# Patient Record
Sex: Female | Born: 1980 | Race: White | Hispanic: No | Marital: Married | Smoking: Never smoker
Health system: Southern US, Community
[De-identification: ages and names within clinical notes are randomized; demographics above are authoritative.]

## PROBLEM LIST (undated history)

## (undated) ENCOUNTER — Emergency Department (HOSPITAL_BASED_OUTPATIENT_CLINIC_OR_DEPARTMENT_OTHER): Admission: EM | Source: Home / Self Care

## (undated) DIAGNOSIS — E039 Hypothyroidism, unspecified: Secondary | ICD-10-CM

## (undated) DIAGNOSIS — E109 Type 1 diabetes mellitus without complications: Secondary | ICD-10-CM

## (undated) DIAGNOSIS — K900 Celiac disease: Secondary | ICD-10-CM

## (undated) DIAGNOSIS — E611 Iron deficiency: Secondary | ICD-10-CM

## (undated) DIAGNOSIS — L4050 Arthropathic psoriasis, unspecified: Secondary | ICD-10-CM

## (undated) DIAGNOSIS — K219 Gastro-esophageal reflux disease without esophagitis: Secondary | ICD-10-CM

## (undated) HISTORY — PX: NO PAST SURGERIES: SHX2092

## (undated) HISTORY — DX: Celiac disease: K90.0

## (undated) HISTORY — DX: Arthropathic psoriasis, unspecified: L40.50

## (undated) HISTORY — DX: Type 1 diabetes mellitus without complications: E10.9

## (undated) HISTORY — DX: Iron deficiency: E61.1

---

## 2015-01-20 DIAGNOSIS — R5383 Other fatigue: Secondary | ICD-10-CM | POA: Insufficient documentation

## 2015-07-27 DIAGNOSIS — R1010 Upper abdominal pain, unspecified: Secondary | ICD-10-CM | POA: Diagnosis not present

## 2015-07-27 DIAGNOSIS — K6389 Other specified diseases of intestine: Secondary | ICD-10-CM | POA: Diagnosis not present

## 2015-07-27 DIAGNOSIS — K649 Unspecified hemorrhoids: Secondary | ICD-10-CM | POA: Diagnosis not present

## 2015-07-27 DIAGNOSIS — K2980 Duodenitis without bleeding: Secondary | ICD-10-CM | POA: Diagnosis not present

## 2015-07-27 DIAGNOSIS — K2950 Unspecified chronic gastritis without bleeding: Secondary | ICD-10-CM | POA: Diagnosis not present

## 2015-07-27 DIAGNOSIS — K921 Melena: Secondary | ICD-10-CM | POA: Diagnosis not present

## 2015-08-07 DIAGNOSIS — E109 Type 1 diabetes mellitus without complications: Secondary | ICD-10-CM | POA: Diagnosis not present

## 2015-08-07 DIAGNOSIS — Z794 Long term (current) use of insulin: Secondary | ICD-10-CM | POA: Diagnosis not present

## 2015-09-22 DIAGNOSIS — Z794 Long term (current) use of insulin: Secondary | ICD-10-CM | POA: Diagnosis not present

## 2015-09-22 DIAGNOSIS — E109 Type 1 diabetes mellitus without complications: Secondary | ICD-10-CM | POA: Diagnosis not present

## 2015-11-01 DIAGNOSIS — K219 Gastro-esophageal reflux disease without esophagitis: Secondary | ICD-10-CM | POA: Diagnosis not present

## 2015-11-01 DIAGNOSIS — K900 Celiac disease: Secondary | ICD-10-CM | POA: Diagnosis not present

## 2015-11-10 DIAGNOSIS — E109 Type 1 diabetes mellitus without complications: Secondary | ICD-10-CM | POA: Diagnosis not present

## 2015-11-10 DIAGNOSIS — E039 Hypothyroidism, unspecified: Secondary | ICD-10-CM | POA: Diagnosis not present

## 2015-12-21 DIAGNOSIS — J329 Chronic sinusitis, unspecified: Secondary | ICD-10-CM | POA: Diagnosis not present

## 2015-12-21 DIAGNOSIS — J40 Bronchitis, not specified as acute or chronic: Secondary | ICD-10-CM | POA: Diagnosis not present

## 2016-03-05 DIAGNOSIS — L409 Psoriasis, unspecified: Secondary | ICD-10-CM | POA: Diagnosis not present

## 2016-03-05 DIAGNOSIS — M2550 Pain in unspecified joint: Secondary | ICD-10-CM | POA: Diagnosis not present

## 2016-03-08 DIAGNOSIS — E039 Hypothyroidism, unspecified: Secondary | ICD-10-CM | POA: Diagnosis not present

## 2016-03-08 DIAGNOSIS — K900 Celiac disease: Secondary | ICD-10-CM | POA: Diagnosis not present

## 2016-03-08 DIAGNOSIS — E109 Type 1 diabetes mellitus without complications: Secondary | ICD-10-CM | POA: Diagnosis not present

## 2016-03-29 DIAGNOSIS — Z794 Long term (current) use of insulin: Secondary | ICD-10-CM | POA: Diagnosis not present

## 2016-03-29 DIAGNOSIS — E109 Type 1 diabetes mellitus without complications: Secondary | ICD-10-CM | POA: Diagnosis not present

## 2016-04-11 DIAGNOSIS — L409 Psoriasis, unspecified: Secondary | ICD-10-CM | POA: Diagnosis not present

## 2016-04-11 DIAGNOSIS — L4059 Other psoriatic arthropathy: Secondary | ICD-10-CM | POA: Diagnosis not present

## 2016-04-11 DIAGNOSIS — M2550 Pain in unspecified joint: Secondary | ICD-10-CM | POA: Diagnosis not present

## 2016-04-22 DIAGNOSIS — J018 Other acute sinusitis: Secondary | ICD-10-CM | POA: Diagnosis not present

## 2016-04-22 DIAGNOSIS — R110 Nausea: Secondary | ICD-10-CM | POA: Diagnosis not present

## 2016-04-22 DIAGNOSIS — E109 Type 1 diabetes mellitus without complications: Secondary | ICD-10-CM | POA: Diagnosis not present

## 2016-05-06 DIAGNOSIS — L409 Psoriasis, unspecified: Secondary | ICD-10-CM | POA: Diagnosis not present

## 2016-05-06 DIAGNOSIS — Z79899 Other long term (current) drug therapy: Secondary | ICD-10-CM | POA: Diagnosis not present

## 2016-06-04 DIAGNOSIS — L409 Psoriasis, unspecified: Secondary | ICD-10-CM | POA: Diagnosis not present

## 2016-07-07 LAB — HM PAP SMEAR: HM PAP: NORMAL

## 2016-07-09 DIAGNOSIS — Z794 Long term (current) use of insulin: Secondary | ICD-10-CM | POA: Diagnosis not present

## 2016-07-09 DIAGNOSIS — E109 Type 1 diabetes mellitus without complications: Secondary | ICD-10-CM | POA: Diagnosis not present

## 2016-07-23 DIAGNOSIS — E039 Hypothyroidism, unspecified: Secondary | ICD-10-CM | POA: Diagnosis not present

## 2016-07-23 DIAGNOSIS — N760 Acute vaginitis: Secondary | ICD-10-CM | POA: Diagnosis not present

## 2016-07-23 DIAGNOSIS — L298 Other pruritus: Secondary | ICD-10-CM | POA: Diagnosis not present

## 2016-07-23 DIAGNOSIS — L409 Psoriasis, unspecified: Secondary | ICD-10-CM | POA: Diagnosis not present

## 2016-07-23 DIAGNOSIS — Z01419 Encounter for gynecological examination (general) (routine) without abnormal findings: Secondary | ICD-10-CM | POA: Diagnosis not present

## 2016-07-23 DIAGNOSIS — L4059 Other psoriatic arthropathy: Secondary | ICD-10-CM | POA: Diagnosis not present

## 2016-07-23 DIAGNOSIS — M2550 Pain in unspecified joint: Secondary | ICD-10-CM | POA: Diagnosis not present

## 2016-07-23 DIAGNOSIS — E109 Type 1 diabetes mellitus without complications: Secondary | ICD-10-CM | POA: Diagnosis not present

## 2016-07-30 DIAGNOSIS — L409 Psoriasis, unspecified: Secondary | ICD-10-CM | POA: Diagnosis not present

## 2016-07-30 DIAGNOSIS — L739 Follicular disorder, unspecified: Secondary | ICD-10-CM | POA: Diagnosis not present

## 2016-07-30 DIAGNOSIS — Z79899 Other long term (current) drug therapy: Secondary | ICD-10-CM | POA: Diagnosis not present

## 2016-08-29 DIAGNOSIS — Z794 Long term (current) use of insulin: Secondary | ICD-10-CM | POA: Diagnosis not present

## 2016-08-29 DIAGNOSIS — E109 Type 1 diabetes mellitus without complications: Secondary | ICD-10-CM | POA: Diagnosis not present

## 2016-09-10 DIAGNOSIS — L409 Psoriasis, unspecified: Secondary | ICD-10-CM | POA: Diagnosis not present

## 2016-10-17 DIAGNOSIS — R031 Nonspecific low blood-pressure reading: Secondary | ICD-10-CM | POA: Diagnosis not present

## 2016-11-19 DIAGNOSIS — L409 Psoriasis, unspecified: Secondary | ICD-10-CM | POA: Diagnosis not present

## 2016-11-19 DIAGNOSIS — Z79899 Other long term (current) drug therapy: Secondary | ICD-10-CM | POA: Diagnosis not present

## 2016-11-21 DIAGNOSIS — E109 Type 1 diabetes mellitus without complications: Secondary | ICD-10-CM | POA: Diagnosis not present

## 2016-11-21 DIAGNOSIS — Z794 Long term (current) use of insulin: Secondary | ICD-10-CM | POA: Diagnosis not present

## 2017-01-31 DIAGNOSIS — L409 Psoriasis, unspecified: Secondary | ICD-10-CM | POA: Diagnosis not present

## 2017-01-31 DIAGNOSIS — L700 Acne vulgaris: Secondary | ICD-10-CM | POA: Diagnosis not present

## 2017-02-03 ENCOUNTER — Emergency Department (HOSPITAL_BASED_OUTPATIENT_CLINIC_OR_DEPARTMENT_OTHER): Payer: BLUE CROSS/BLUE SHIELD

## 2017-02-03 ENCOUNTER — Observation Stay (HOSPITAL_BASED_OUTPATIENT_CLINIC_OR_DEPARTMENT_OTHER)
Admission: EM | Admit: 2017-02-03 | Discharge: 2017-02-04 | Disposition: A | Discharge status: Home / Self Care | Payer: BLUE CROSS/BLUE SHIELD | Attending: Family Medicine | Admitting: Family Medicine

## 2017-02-03 ENCOUNTER — Encounter (HOSPITAL_BASED_OUTPATIENT_CLINIC_OR_DEPARTMENT_OTHER): Payer: Self-pay | Admitting: Emergency Medicine

## 2017-02-03 DIAGNOSIS — E119 Type 2 diabetes mellitus without complications: Secondary | ICD-10-CM | POA: Insufficient documentation

## 2017-02-03 DIAGNOSIS — K219 Gastro-esophageal reflux disease without esophagitis: Secondary | ICD-10-CM | POA: Diagnosis present

## 2017-02-03 DIAGNOSIS — E039 Hypothyroidism, unspecified: Secondary | ICD-10-CM | POA: Diagnosis not present

## 2017-02-03 DIAGNOSIS — M7989 Other specified soft tissue disorders: Secondary | ICD-10-CM | POA: Insufficient documentation

## 2017-02-03 DIAGNOSIS — M79641 Pain in right hand: Principal | ICD-10-CM | POA: Insufficient documentation

## 2017-02-03 DIAGNOSIS — M659 Synovitis and tenosynovitis, unspecified: Secondary | ICD-10-CM | POA: Diagnosis not present

## 2017-02-03 DIAGNOSIS — M65949 Unspecified synovitis and tenosynovitis, unspecified hand: Secondary | ICD-10-CM

## 2017-02-03 DIAGNOSIS — Z794 Long term (current) use of insulin: Secondary | ICD-10-CM | POA: Insufficient documentation

## 2017-02-03 DIAGNOSIS — M65941 Unspecified synovitis and tenosynovitis, right hand: Secondary | ICD-10-CM | POA: Insufficient documentation

## 2017-02-03 DIAGNOSIS — E079 Disorder of thyroid, unspecified: Secondary | ICD-10-CM | POA: Diagnosis not present

## 2017-02-03 DIAGNOSIS — L089 Local infection of the skin and subcutaneous tissue, unspecified: Secondary | ICD-10-CM

## 2017-02-03 DIAGNOSIS — S61234A Puncture wound without foreign body of right ring finger without damage to nail, initial encounter: Secondary | ICD-10-CM | POA: Diagnosis not present

## 2017-02-03 DIAGNOSIS — M65841 Other synovitis and tenosynovitis, right hand: Secondary | ICD-10-CM | POA: Diagnosis not present

## 2017-02-03 DIAGNOSIS — E109 Type 1 diabetes mellitus without complications: Secondary | ICD-10-CM

## 2017-02-03 DIAGNOSIS — Z79899 Other long term (current) drug therapy: Secondary | ICD-10-CM | POA: Diagnosis not present

## 2017-02-03 HISTORY — DX: Gastro-esophageal reflux disease without esophagitis: K21.9

## 2017-02-03 HISTORY — DX: Hypothyroidism, unspecified: E03.9

## 2017-02-03 LAB — CBC WITH DIFFERENTIAL/PLATELET
BASOS ABS: 0 10*3/uL (ref 0.0–0.1)
Basophils Relative: 0 %
EOS PCT: 2 %
Eosinophils Absolute: 0.1 10*3/uL (ref 0.0–0.7)
HCT: 40.5 % (ref 36.0–46.0)
Hemoglobin: 14.1 g/dL (ref 12.0–15.0)
LYMPHS PCT: 47 %
Lymphs Abs: 2.8 10*3/uL (ref 0.7–4.0)
MCH: 30.4 pg (ref 26.0–34.0)
MCHC: 34.8 g/dL (ref 30.0–36.0)
MCV: 87.3 fL (ref 78.0–100.0)
Monocytes Absolute: 0.6 10*3/uL (ref 0.1–1.0)
Monocytes Relative: 10 %
Neutro Abs: 2.4 10*3/uL (ref 1.7–7.7)
Neutrophils Relative %: 41 %
PLATELETS: 259 10*3/uL (ref 150–400)
RBC: 4.64 MIL/uL (ref 3.87–5.11)
RDW: 12.3 % (ref 11.5–15.5)
WBC: 6 10*3/uL (ref 4.0–10.5)

## 2017-02-03 LAB — BASIC METABOLIC PANEL
ANION GAP: 7 (ref 5–15)
BUN: 17 mg/dL (ref 6–20)
CO2: 26 mmol/L (ref 22–32)
Calcium: 9.2 mg/dL (ref 8.9–10.3)
Chloride: 102 mmol/L (ref 101–111)
Creatinine, Ser: 0.7 mg/dL (ref 0.44–1.00)
GLUCOSE: 85 mg/dL (ref 65–99)
POTASSIUM: 3.6 mmol/L (ref 3.5–5.1)
Sodium: 135 mmol/L (ref 135–145)

## 2017-02-03 LAB — CBG MONITORING, ED
GLUCOSE-CAPILLARY: 75 mg/dL (ref 65–99)
GLUCOSE-CAPILLARY: 82 mg/dL (ref 65–99)

## 2017-02-03 MED ORDER — SODIUM CHLORIDE 0.9 % IV BOLUS (SEPSIS)
1000.0000 mL | Freq: Once | INTRAVENOUS | Status: AC
  Administered 2017-02-04: 1000 mL via INTRAVENOUS

## 2017-02-03 MED ORDER — IBUPROFEN 600 MG PO TABS: 600.0000 mg | ORAL_TABLET | Freq: Four times a day (QID) | ORAL | Status: DC

## 2017-02-03 MED ORDER — SODIUM CHLORIDE 0.9 % IV SOLN
1.5000 g | Freq: Once | INTRAVENOUS | Status: AC
  Administered 2017-02-04: 1.5 g via INTRAVENOUS
  Filled 2017-02-03: qty 1.5

## 2017-02-03 MED ORDER — SODIUM CHLORIDE 0.9 % IV SOLN
INTRAVENOUS | Status: DC
  Administered 2017-02-04: 07:00:00 via INTRAVENOUS

## 2017-02-03 MED ORDER — OXYCODONE-ACETAMINOPHEN 5-325 MG PO TABS: 1.0000 | ORAL_TABLET | ORAL | Status: DC | PRN

## 2017-02-03 MED ORDER — MORPHINE SULFATE (PF) 2 MG/ML IV SOLN
2.0000 mg | INTRAVENOUS | Status: DC | PRN
  Administered 2017-02-04: 2 mg via INTRAVENOUS
  Filled 2017-02-03: qty 1

## 2017-02-03 NOTE — ED Provider Notes (Signed)
[facility] EMERGENCY DEPARTMENT Provider Note   CSN: [id number] Arrival date & time: 02/03/17  1812     History   Chief Complaint Chief Complaint  Patient presents with  . Hand Pain    HPI   Blood pressure (!) 163/98, pulse 66, temperature 98.4 F (36.9 C), temperature source Oral, resp. rate 18, height 5' 2"  (1.575 m), weight 54 kg (119 lb), last menstrual period 01/13/2017, SpO2 100 %.  Sarah Flynn is a 36 y.o. female with past medical history significant for insulin dependent diabetes (she is on a pump) complaining of severe pain to right fourth digit worsening over the course of last day.  She states that she had a puncture wound to the palm just below the fourth digit 3 weeks ago and a the form of a plant entered the palm.  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  She states that the swelling has gotten significantly worse over the last 24 hours.  She is right-hand dominant.  She states that her sugars have been running in the 130s, she disconnected her pump prior to arrival.  She had lunch at around noon and has had no solids since that time.  Past Medical History:  Diagnosis Date  . Diabetes mellitus without complication ([place])   . GERD (gastroesophageal reflux disease)   . Hypothyroidism   . Thyroid disease     Patient Active Problem List   Diagnosis Date Noted  . Tenosynovitis of right hand 02/03/2017  . GERD (gastroesophageal reflux disease)   . Diabetes mellitus without complication ([place])   . Hypothyroidism     History reviewed. No pertinent surgical history.  OB History    No data available       Home Medications    Prior to Admission medications   Medication Sig Start Date End Date Taking? Authorizing Provider  doxycycline  (VIBRAMYCIN) 50 MG capsule Take 50 mg by mouth 2 (two) times daily.   Yes [provider]  insulin aspart (NOVOLOG) 100 UNIT/ML injection Inject into the skin 3 (three) times daily before meals.   Yes [provider]  levothyroxine (SYNTHROID, LEVOTHROID) 50 MCG tablet Take 50 mcg by mouth daily before breakfast.   Yes [provider]  omeprazole (PRILOSEC) 20 MG capsule Take 20 mg by mouth daily.   Yes [provider]    Family History History reviewed. No pertinent family history.  Social History Social History  Substance Use Topics  . Smoking status: Never Smoker  . Smokeless tobacco: Never Used  . Alcohol use No     Allergies   Patient has no known allergies.   Review of Systems Review of Systems  A complete review of systems was obtained and all systems are negative except as noted in the HPI and PMH.   Physical Exam Updated Vital Signs BP (!) 152/80 (BP Location: Left Arm)   Pulse 69   Temp 98.4 F (36.9 C) (Oral)   Resp 20   Ht 5' 2"  (1.575 m)   Wt 54 kg (119 lb)   LMP 01/13/2017   SpO2 100%   BMI 21.77 kg/m   Physical Exam  Constitutional: She is oriented to person, place, and time. She appears well-developed and well-nourished. No distress.  HENT:  Head: Normocephalic and atraumatic.  Mouth/Throat: Oropharynx is clear and moist.  Eyes: Pupils are equal, round, and reactive to light. Conjunctivae and EOM are normal.  Neck: Normal range of motion.  Cardiovascular: Normal rate, regular rhythm and intact distal pulses.   Pulmonary/Chest: Effort normal and breath sounds normal.  Abdominal: Soft. There is no tenderness.  Musculoskeletal: She exhibits edema and tenderness. She exhibits no deformity.  Diffuse swelling to the right fourth digit, exquisitely tender to palpation along the flexor tendon sheath, Pain with passive extension of the finger.  Distally neurovascularly intact.  No tenderness to palpation along the initial  puncture wound site at the head of the fourth metacarpal.  Neurological: She is alert and oriented to person, place, and time.  Skin: She is not diaphoretic.  Psychiatric: She has a normal mood and affect.  Nursing note and vitals reviewed.    ED Treatments / Results  Labs (all labs ordered are listed, but only abnormal results are displayed) Labs Reviewed  CULTURE, BLOOD (ROUTINE X 2)  CULTURE, BLOOD (ROUTINE X 2)  CBC WITH DIFFERENTIAL/PLATELET  BASIC METABOLIC PANEL  LACTIC ACID, PLASMA  HCG, QUANTITATIVE, PREGNANCY  CBG MONITORING, ED  CBG MONITORING, ED    EKG  EKG Interpretation None       Radiology Dg Hand Complete Right  Result Date: 02/03/2017 CLINICAL DATA:  Right hand pain/swelling EXAM: RIGHT HAND - COMPLETE 3+ VIEW COMPARISON:  None. FINDINGS: No fracture or dislocation is seen. The joint spaces are preserved. Visualized soft tissues are within normal limits. No radiopaque foreign body is seen. IMPRESSION: No fracture, dislocation, or radiopaque foreign body is seen. Electronically Signed   By: Julian Hy M.D.   On: 02/03/2017 18:41    Procedures Procedures (including critical care time)  Medications Ordered in ED Medications  0.9 %  sodium chloride infusion (not administered)  ampicillin-sulbactam (UNASYN) 1.5 g in sodium chloride 0.9 % 50 mL IVPB (not administered)  ibuprofen (ADVIL,MOTRIN) tablet 600 mg (not administered)  oxyCODONE-acetaminophen (PERCOCET/ROXICET) 5-325 MG per tablet 1 tablet (not administered)  morphine 2 MG/ML injection 2 mg (not administered)  sodium chloride 0.9 % bolus 1,000 mL (not administered)     Initial Impression / Assessment and Plan / ED Course  I have reviewed the triage vital signs and the nursing notes.  Pertinent labs & imaging results that were available during my care of the patient were reviewed by me and considered in my medical decision making (see chart for details).     Vitals:   02/03/17 1820  02/03/17 1822 02/03/17 2036  BP:  (!) 163/98 (!) 152/80  Pulse:  66 69  Resp:  18 20  Temp:  98.4 F (36.9 C)   TempSrc:  Oral   SpO2:  100% 100%  Weight: 54 kg (119 lb)    Height: 5' 2"  (1.575 m)      Medications  0.9 %  sodium chloride infusion (not administered)  ampicillin-sulbactam (UNASYN) 1.5 g in sodium chloride 0.9 % 50 mL IVPB (not administered)  ibuprofen (ADVIL,MOTRIN) tablet 600 mg (not administered)  oxyCODONE-acetaminophen (PERCOCET/ROXICET) 5-325 MG per tablet 1 tablet (not administered)  morphine 2 MG/ML injection 2 mg (not administered)  sodium chloride 0.9 % bolus 1,000 mL (not administered)    Sarah Flynn is 36 y.o. female presenting with intermittent and recurrent swelling to right fourth digit after she had a puncture wound to the volar aspects of the palm at the head of the fourth metacarpal 3 weeks ago.  Concern for flexor tenosynovitis  Blood work reassuring with no elevation in lactic acid blood glucose.  Hand surgery consult from Dr. Grandville Silos appreciated: He does not think that this is an acute purulent tenosynovitis that would benefit from washout, thinks it may be a smoldering infection recommends she is admitted for IV antibiotics and possible ID consult to help guide antibiotic choice.  He will follow in consult.  Recommends admission to [facility].  Discussed with Dr. Blaine Hamper who accepts transfer to [facility].   ID consult from Dr. Graylon Good appreciated: She agrees with Unasyn, they will evaluate this patient in the morning and make further recommendations.  Final Clinical Impressions(s) / ED Diagnoses   Final diagnoses:  Gastroesophageal reflux disease without esophagitis  Diabetes mellitus without complication ([place])  Hypothyroidism, unspecified type  Flexor tenosynovitis of finger    New Prescriptions New Prescriptions   No medications on file     Waynetta Pean 02/03/17 2327    Fredia Sorrow, MD 02/04/17 [phone]

## 2017-02-03 NOTE — Consult Note (Signed)
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  She reports approximately 3 weeks ago, she was pulling up some bushes in the yard, not realizing that the were thorny, and had a thorn puncture her skin of the palm just proximal to the base of the ring finger.  It became painful and swollen but resolved largely within a day or so.  She has had some intermittent pain and swelling, but it is worsened over the past 24 hours.  Her blood sugars have been relatively stable, and blood work in the emergency department reveals a glucose of 85 despite being disconnected from her insulin pump prior to arrival, and her WBC is just 6.0.  She has been taking doxycycline for 3 days for an unrelated breakout on her back.  She reports that she was able to type at work today, but other tasks were more difficult and painful.  She also admits that she has not been actively moving the finger from full flexion into extension over the course of the past couple of weeks.  Past Medical History:  Diagnosis Date  . Diabetes mellitus without complication ([place])   . GERD (gastroesophageal reflux disease)   . Thyroid disease    History reviewed. No pertinent surgical history. Social History   Social History  . Marital status: Married    Spouse name: N/A  . Number of children: N/A  . Years of education: N/A   Social History Main Topics  . Smoking status: Never Smoker  . Smokeless tobacco: Never Used  . Alcohol use No  . Drug use: No  . Sexual activity: Not Asked   Other Topics Concern  . None   Social History Narrative  . None   History reviewed. No pertinent family history. No Known Allergies Prior to Admission medications   Medication  Sig Start Date End Date Taking? Authorizing Provider  doxycycline (VIBRAMYCIN) 50 MG capsule Take 50 mg by mouth 2 (two) times daily.   Yes [provider]  insulin aspart (NOVOLOG) 100 UNIT/ML injection Inject into the skin 3 (three) times daily before meals.   Yes [provider]  levothyroxine (SYNTHROID, LEVOTHROID) 50 MCG tablet Take 50 mcg by mouth daily before breakfast.   Yes [provider]  omeprazole (PRILOSEC) 20 MG capsule Take 20 mg by mouth daily.   Yes [provider]   Dg Hand Complete Right  Result Date: 02/03/2017 CLINICAL DATA:  Right hand pain/swelling EXAM: RIGHT HAND - COMPLETE 3+ VIEW COMPARISON:  None. FINDINGS: No fracture or dislocation is seen. The joint spaces are preserved. Visualized soft tissues are within normal limits. No radiopaque foreign body is seen. IMPRESSION: No fracture, dislocation, or radiopaque foreign body is seen. Electronically Signed   By: Julian Hy M.D.   On: 02/03/2017 18:41    Positive ROS: All other systems have been reviewed and were otherwise negative with the exception of those mentioned in the HPI and as above.  Physical Exam: Vitals: Refer to EMR. Constitutional:  WD, WN, NAD HEENT:  NCAT, EOMI Neuro/Psych:  Alert & oriented to person, place, and time; appropriate mood & affect Lymphatic: No generalized extremity edema or lymphadenopathy Extremities / MSK:  The extremities are normal with respect to appearance, ranges of motion, joint  stability, muscle strength/tone, sensation, & perfusion except as otherwise noted:  The right ring finger is just very mildly swollen, but it is visibly appreciable.  There is no fusiform swelling.  Many of this minor skin wrinkles remain discernible, and the finger is held in nearly full extension, with only a very slight natural flexed posture.  She does allow passive extension to near full extension before limited by some discomfort.  Actively, she flexes all  the fingertips to about 1.5 cm from the palm.  There is some tenderness along the volar surface of the digit, very little in the palm itself.  The most tender places along the volar surface of the P1 region.  Intact flexor and extensor tendons, NVI.  Assessment: Subacute right ring finger process, 3 weeks following puncture wound from a thorny plant.  Plan: At this time, if this does represent an infectious flexor tenosynovitis, it is very mild, and not consistent with more typical cases of purulent bacterial flexor tenosynovitis.  Given the mechanism of injury, a septic flexor tenosynovitis or even atypical infections such as sporotrichosis must remain in the differential diagnosis.  I have spoken to her, indicated that it is my opinion surgical intervention is not indicated at this time, but she would benefit from admission, administration of IV antibiotics, and observation to see how this progresses over the next 12-24 hours.  In addition, given this somewhat slow and indolent time course and unusual source of injury, infectious disease consultation would be helpful.  I have requested that if she is to be admitted, it be to [facility], and I can remain available as may be necessary for surgical intervention should such indications arise.  Additionally, NSAIDs can be symptomatically helpful as well.  Please withhold all forms of anticoagulation while we observe to see if surgical indications develop.  For now, she need not be NPO.  Rayvon Char Grandville Silos, [address] [place], [place]  [address] Office: [phone] Mobile: [phone]  02/03/2017, 9:52 PM

## 2017-02-03 NOTE — Progress Notes (Signed)
This is a no charge note   Transfer from [facility] per [place], [place]  36 year old-year-old lady with past medical history of diabetes, GERD, hypothyroidism, who presents with right hand pain and swelling. Pt had injury to the 4th digit of right hand 3 weeks ago when cutting bushes. She developed right hand swelling and the pain. Ortho, Dr. Grandville Silos evaluated the patient-->pt likely to have bacterial flexor tenosynovitis. Pt was started with IV Unasyn. ID, Dr. Graylon Good was consulted and will see pt in AM.  Patient was found to have WBC 6.0, electrolytes renal function okay, temperature normal, no tachycardia, oxygen saturation 100% on room air. X-ray of right hand is negative for bony fracture. Patient is admitted to [facility] bed as inpatient.  Please call manager of [facility] at [phone] when pt arrives to floor   Ivor Costa, MD  [facility] Pager [phone]  If 7PM-7AM, please contact night-coverage [email or web address] Password [email or web address] 02/03/2017, 11:58 PM

## 2017-02-03 NOTE — ED Notes (Signed)
ED Provider at bedside. 

## 2017-02-03 NOTE — ED Triage Notes (Signed)
Patient reports that she was cutting the bushes about 3 weeks ago and hurt her right hand. Last night the pain was so bad that she woke up - swelling is increased today

## 2017-02-04 ENCOUNTER — Encounter (HOSPITAL_COMMUNITY): Payer: Self-pay | Admitting: Family Medicine

## 2017-02-04 DIAGNOSIS — Z8249 Family history of ischemic heart disease and other diseases of the circulatory system: Secondary | ICD-10-CM

## 2017-02-04 DIAGNOSIS — Z9641 Presence of insulin pump (external) (internal): Secondary | ICD-10-CM | POA: Diagnosis not present

## 2017-02-04 DIAGNOSIS — S61234A Puncture wound without foreign body of right ring finger without damage to nail, initial encounter: Secondary | ICD-10-CM

## 2017-02-04 DIAGNOSIS — E039 Hypothyroidism, unspecified: Secondary | ICD-10-CM | POA: Diagnosis not present

## 2017-02-04 DIAGNOSIS — L089 Local infection of the skin and subcutaneous tissue, unspecified: Secondary | ICD-10-CM

## 2017-02-04 DIAGNOSIS — Z794 Long term (current) use of insulin: Secondary | ICD-10-CM

## 2017-02-04 DIAGNOSIS — W60XXXA Contact with nonvenomous plant thorns and spines and sharp leaves, initial encounter: Secondary | ICD-10-CM

## 2017-02-04 DIAGNOSIS — K219 Gastro-esophageal reflux disease without esophagitis: Secondary | ICD-10-CM | POA: Diagnosis not present

## 2017-02-04 DIAGNOSIS — E109 Type 1 diabetes mellitus without complications: Secondary | ICD-10-CM

## 2017-02-04 DIAGNOSIS — M659 Synovitis and tenosynovitis, unspecified: Secondary | ICD-10-CM

## 2017-02-04 LAB — CBG MONITORING, ED: GLUCOSE-CAPILLARY: 233 mg/dL — AB (ref 65–99)

## 2017-02-04 LAB — HCG, QUANTITATIVE, PREGNANCY

## 2017-02-04 LAB — GLUCOSE, CAPILLARY
GLUCOSE-CAPILLARY: 127 mg/dL — AB (ref 65–99)
Glucose-Capillary: 36 mg/dL — CL (ref 65–99)
Glucose-Capillary: 115 mg/dL — ABNORMAL HIGH (ref 65–99)

## 2017-02-04 LAB — I-STAT CG4 LACTIC ACID, ED: Lactic Acid, Venous: 0.93 mmol/L (ref 0.5–1.9)

## 2017-02-04 MED ORDER — ONDANSETRON HCL 4 MG/2ML IJ SOLN: 4.0000 mg | Freq: Four times a day (QID) | INTRAMUSCULAR | Status: DC | PRN

## 2017-02-04 MED ORDER — MELOXICAM 15 MG PO TABS: 15.0000 mg | ORAL_TABLET | Freq: Every day | ORAL | 0 refills | Status: DC

## 2017-02-04 MED ORDER — ACETAMINOPHEN 500 MG PO TABS
1000.0000 mg | ORAL_TABLET | Freq: Once | ORAL | Status: AC
  Administered 2017-02-04: 1000 mg via ORAL
  Filled 2017-02-04: qty 2

## 2017-02-04 MED ORDER — PANTOPRAZOLE SODIUM 40 MG PO TBEC
40.0000 mg | DELAYED_RELEASE_TABLET | Freq: Every day | ORAL | Status: DC
  Administered 2017-02-04: 40 mg via ORAL
  Filled 2017-02-04: qty 1

## 2017-02-04 MED ORDER — SODIUM CHLORIDE 0.9 % IV SOLN: 250.0000 mL | INTRAVENOUS | Status: DC | PRN

## 2017-02-04 MED ORDER — ONDANSETRON HCL 4 MG PO TABS: 4.0000 mg | ORAL_TABLET | Freq: Four times a day (QID) | ORAL | Status: DC | PRN

## 2017-02-04 MED ORDER — INSULIN ASPART 100 UNIT/ML ~~LOC~~ SOLN: 0.0000 [IU] | Freq: Every day | SUBCUTANEOUS | Status: DC

## 2017-02-04 MED ORDER — MELOXICAM 7.5 MG PO TABS
15.0000 mg | ORAL_TABLET | Freq: Every day | ORAL | Status: DC
Start: 2017-02-04 — End: 2017-02-04
  Administered 2017-02-04: 15 mg via ORAL
  Filled 2017-02-04: qty 2

## 2017-02-04 MED ORDER — INSULIN ASPART 100 UNIT/ML ~~LOC~~ SOLN: 0.0000 [IU] | Freq: Three times a day (TID) | SUBCUTANEOUS | Status: DC

## 2017-02-04 MED ORDER — LEVOTHYROXINE SODIUM 50 MCG PO TABS
50.0000 ug | ORAL_TABLET | Freq: Every day | ORAL | Status: DC
  Administered 2017-02-04: 50 ug via ORAL
  Filled 2017-02-04: qty 1

## 2017-02-04 MED ORDER — SODIUM CHLORIDE 0.9 % IV SOLN
3.0000 g | Freq: Four times a day (QID) | INTRAVENOUS | Status: DC
  Administered 2017-02-04 (×3): 3 g via INTRAVENOUS
  Filled 2017-02-04 (×5): qty 3

## 2017-02-04 MED ORDER — MORPHINE SULFATE (PF) 4 MG/ML IV SOLN: 2.0000 mg | INTRAVENOUS | Status: DC | PRN

## 2017-02-04 MED ORDER — SODIUM CHLORIDE 0.9% FLUSH: 3.0000 mL | INTRAVENOUS | Status: DC | PRN

## 2017-02-04 MED ORDER — FLUCONAZOLE 150 MG PO TABS: 150.0000 mg | ORAL_TABLET | Freq: Every day | ORAL | 0 refills | Status: DC

## 2017-02-04 MED ORDER — AMOXICILLIN-POT CLAVULANATE 875-125 MG PO TABS: 1.0000 | ORAL_TABLET | Freq: Two times a day (BID) | ORAL | 0 refills | Status: DC

## 2017-02-04 MED ORDER — SODIUM CHLORIDE 0.9% FLUSH: 3.0000 mL | Freq: Two times a day (BID) | INTRAVENOUS | Status: DC

## 2017-02-04 NOTE — Progress Notes (Signed)
Pt gave self bolus of 4.8 units from insulin pump.

## 2017-02-04 NOTE — Consult Note (Signed)
[facility] for Infectious Disease    Date of Admission:  02/03/2017   Total days of antibiotics 1        Day 1 Unasyn               Reason for Consult: Infectious Tenosynovitis    Referring Provider: Waldemar Dickens, MD  Assessment: Patient with 3 weeks of waxing and waning pain and mild swelling of right 4th digit following puncture by bush. No warmth or eythema. No fevers, chills, or leukocytosis. Suspect infectious tenosynovitis, most commonly 2/2 staph and step species. Patient has shown improvement on Unasyn; improved range of motion, decreased pain.  Plan: Transition to Augmentin 875-125 q12h, for total course of 10 days  Active Problems:   GERD (gastroesophageal reflux disease)   Hypothyroidism   Finger infection   Type 1 diabetes ([place])   Scheduled Meds: . [START ON 02/05/2017] levothyroxine  50 mcg Oral QAC breakfast  . meloxicam  15 mg Oral Daily  . pantoprazole  40 mg Oral Daily  . sodium chloride flush  3 mL Intravenous Q12H   Continuous Infusions: . sodium chloride    . ampicillin-sulbactam (UNASYN) IV Stopped (02/04/17 1440)   PRN Meds:.sodium chloride, morphine injection, ondansetron **OR** ondansetron (ZOFRAN) IV, oxyCODONE-acetaminophen, sodium chloride flush  HPI: Sarah Flynn is a 36 y.o. female with a history of diabetes (on insulin pump), GERD, and hypothyroidism who presented with right hand pain and swelling. She initially injured her hand approximatelty 3 weeks ago when it was punctured by a bush just proximal to her right 4th digit. Over the following day she experienced thenderness and swelling of the area. The tenderness and swelling waxed and waned since that time, but became significantly worse 24 hours prior to admission and was interfering with here work and other daily task. Pain is described as a mix of dull pain with intermittent "firey" shooting pain and located primarily over the flexor surfaces. The pain is dull today and has  improved since she came to the ED per the patient. She denies fevers, chills. Of note patient was taking doxycycline over the last 3 days for an unrelated skin condition of her back.    Review of Systems: Review of Systems  Constitutional: Negative for chills and fever.    Past Medical History:  Diagnosis Date  . Diabetes mellitus without complication ([place])   . GERD (gastroesophageal reflux disease)   . Hypothyroidism   . Thyroid disease     Social History  Substance Use Topics  . Smoking status: Never Smoker  . Smokeless tobacco: Never Used  . Alcohol use No    Family History  Problem Relation Age of Onset  . Heart attack Paternal Grandfather    No Known Allergies  OBJECTIVE: Blood pressure 110/63, pulse 90, temperature 98.7 F (37.1 C), temperature source Oral, resp. rate 18, height 5' 2"  (1.575 m), weight 119 lb (54 kg), last menstrual period 01/13/2017, SpO2 97 %.  Physical Exam  Constitutional: She is oriented to person, place, and time and well-developed, well-nourished, and in no distress.  HENT:  Head: Normocephalic and atraumatic.  Eyes: EOM are normal. Right eye exhibits no discharge.  Cardiovascular: Normal rate, regular rhythm and normal heart sounds.   Pulmonary/Chest: Effort normal and breath sounds normal. No respiratory distress.  Abdominal: Soft. Bowel sounds are normal. She exhibits no distension. There is no tenderness.  Musculoskeletal: She exhibits no edema or deformity.  No warmth, erythema  or significant edema of right hand Mild tenderness to palpation of proximal 4th digit   Neurological: She is alert and oriented to person, place, and time.  Skin: Skin is warm and dry.    Lab Results Lab Results  Component Value Date   WBC 6.0 02/03/2017   HGB 14.1 02/03/2017   HCT 40.5 02/03/2017   MCV 87.3 02/03/2017   PLT 259 02/03/2017    Lab Results  Component Value Date   CREATININE 0.70 02/03/2017   BUN 17 02/03/2017   NA 135 02/03/2017   K  3.6 02/03/2017   CL 102 02/03/2017   CO2 26 02/03/2017   No results found for: ALT, AST, GGT, ALKPHOS, BILITOT   Microbiology: No results found for this or any previous visit (from the past 240 hour(s)).  Neva Seat, MD IM PGY-1 02/04/2017, 2:46 PM

## 2017-02-04 NOTE — Progress Notes (Signed)
Patient admitted to [facility] from [place] with right ring finger pain and swelling. Alert and oriented x4, independent,VS stable, denies pai at this time. Oriented to room and call bell. [facility] flow manager  For admissions text paged. Awaiting for md  admission orders.

## 2017-02-04 NOTE — Discharge Instructions (Signed)
You were admitted to the hospital with what was becoming a serious finger infection. You responded well to the antibiotics which is indicative of a bacterial infection. Sometimes these injuries can be related to a fungal infection, but this is less likely. Please complete 10 days of antibiotics by taking the Augmentin. Please use the meloxicam for pain and swelling relief. Please perform range of motion exercises on you hand/finger twice daily to ensure return of your mobility. [place] the diflucan if you develop a yeast infection.

## 2017-02-04 NOTE — Progress Notes (Signed)
Inpatient Diabetes Program Recommendations  AACE/ADA: New Consensus Statement on Inpatient Glycemic Control (2015)  Target Ranges:  Prepandial:   less than 140 mg/dL      Peak postprandial:   less than 180 mg/dL (1-2 hours)      Critically ill patients:  140 - 180 mg/dL   Lab Results  Component Value Date   GLUCAP 127 (H) 02/04/2017    Review of Glycemic ControlResults for [name], [name] (MRN [id number]) as of 02/04/2017 11:35  Ref. Range 02/03/2017 20:32 02/03/2017 21:42 02/04/2017 03:58 02/04/2017 08:12  Glucose-Capillary Latest Ref Range: 65 - 99 mg/dL 75 82 233 (H) 127 (H)   Diabetes history: Type 1 DM- since 36 years old Outpatient Diabetes medications: Insulin pump-[employer] Basal: 12a-3a-0.65 units/hr               3a-6a-0.675 units/hr              6a-3p-0.65 units/hr               3p-8p-0.575 units/hr               8p-12a- 0.625 units/hr               Total basal=15.2 units/24 hours CHO coverage: 12a-6p- 1 unit/13 grams of CHO                             6p-MN-1 unit/12 grams of CHO Correction Factor:  1 unit drops blood sugar approximately 100 mg/dL Current orders for Inpatient glycemic control:  Insulin pump  Inpatient Diabetes Program Recommendations:   Spoke with patient regarding insulin pump.  She is alert and oriented and able to use her insulin pump.  She is also wearing a Freestyle Libre- CGM for glucose monitoring.  Patient has signed contract.  She is due to change site tomorrow and has supplies at bedside.  Thanks, Adah Perl, RN, BC-ADM Inpatient Diabetes Coordinator Pager [phone] (8a-5p)

## 2017-02-04 NOTE — Progress Notes (Signed)
Pt gave self 2.6U Bolus from insulin pump.

## 2017-02-04 NOTE — H&P (Addendum)
History and Physical    Sarah Flynn QAE:[id number] DOB: 03/14/1981 DOA: 02/03/2017  PCP: No primary care provider on file. Patient coming from: Home >>> [facility]  Chief Complaint: R hand pain and swelling  HPI: Sarah Flynn is a 36 y.o. female with medical history significant of diabetes, GERD, hypothyroidism. Patient reports right hand injury approximately 3 weeks prior to admission. Patient sustained a foreign puncture wound from a bush on her right ring finger at that time. Over the next 24 hours the area became swollen and tender. Swelling and tenderness remained relatively unchanged with some waxing and waning nature until approximately 24 hours prior to admission when he became significantly worse. Of note patient was taking doxycycline over the last 3 days for an unrelated skin condition of her back. Decreased function with the right hand due to the pain and swelling. Denies fevers, chest pain, shortness of breath, palpitations, focal neurological deficits, neck stiffness, headache, unexplained rash, night sweats.    ED Course: Hand consulted who recommended admission for IV ABX  Review of Systems: As per HPI otherwise all other systems reviewed and are negative  Ambulatory Status:no restrictions  Past Medical History:  Diagnosis Date  . Diabetes mellitus without complication ([place])   . GERD (gastroesophageal reflux disease)   . Hypothyroidism   . Thyroid disease     History reviewed. No pertinent surgical history.  Social History   Social History  . Marital status: Married    Spouse name: N/A  . Number of children: N/A  . Years of education: N/A   Occupational History  . Not on file.   Social History Main Topics  . Smoking status: Never Smoker  . Smokeless tobacco: Never Used  . Alcohol use No  . Drug use: No  . Sexual activity: Not on file   Other Topics Concern  . Not on file   Social History Narrative  . No narrative on file    No Known Allergies  Family  History  Problem Relation Age of Onset  . Heart attack Paternal Grandfather       Prior to Admission medications   Medication Sig Start Date End Date Taking? Authorizing Provider  doxycycline (VIBRAMYCIN) 50 MG capsule Take 50 mg by mouth 2 (two) times daily.   Yes [provider]  insulin aspart (NOVOLOG) 100 UNIT/ML injection Inject into the skin 3 (three) times daily before meals.   Yes [provider]  levothyroxine (SYNTHROID, LEVOTHROID) 50 MCG tablet Take 50 mcg by mouth daily before breakfast.   Yes [provider]  omeprazole (PRILOSEC) 20 MG capsule Take 20 mg by mouth daily.   Yes [provider]    Physical Exam: Vitals:   02/04/17 0053 02/04/17 0400 02/04/17 0400 02/04/17 0531  BP: 119/75 120/78 120/78 121/75  Pulse: 73 72 73 77  Resp: 16  17 18   Temp:   98.5 F (36.9 C) 98 F (36.7 C)  TempSrc:   Oral Oral  SpO2: 100% 100% 97% 99%  Weight:    54 kg (119 lb)  Height:    5' 2"  (1.575 m)     General:  Appears calm and comfortable Eyes:  PERRL, EOMI, normal lids, iris ENT:  grossly normal hearing, lips & tongue, mmm Neck:  no LAD, masses or thyromegaly Cardiovascular:  RRR, no m/r/g. No LE edema.  Respiratory:  CTA bilaterally, no w/r/r. Normal respiratory effort. Abdomen:  soft, ntnd, NABS Skin:  no rash or induration seen on limited exam Musculoskeletal:  Minimal diffuse swelling of the R ring finger w/ near full extension and flesion. No bony abnormalities appreciated. grossly normal tone BUE/BLE,  Psychiatric:  grossly normal mood and affect, speech fluent and appropriate, AOx3 Neurologic:  CN 2-12 grossly intact, moves all extremities in coordinated fashion, sensation intact  Labs on Admission: I have personally reviewed following labs and imaging studies  CBC:  Recent Labs Lab 02/03/17 2040  WBC 6.0  NEUTROABS 2.4  HGB 14.1  HCT 40.5  MCV 87.3  PLT 062   Basic Metabolic Panel:  Recent Labs Lab 02/03/17 2040   NA 135  K 3.6  CL 102  CO2 26  GLUCOSE 85  BUN 17  CREATININE 0.70  CALCIUM 9.2   GFR: Estimated Creatinine Clearance: 75.4 mL/min (by C-G formula based on SCr of 0.7 mg/dL). Liver Function Tests: No results for input(s): AST, ALT, ALKPHOS, BILITOT, PROT, ALBUMIN in the last 168 hours. No results for input(s): LIPASE, AMYLASE in the last 168 hours. No results for input(s): AMMONIA in the last 168 hours. Coagulation Profile: No results for input(s): INR, PROTIME in the last 168 hours. Cardiac Enzymes: No results for input(s): CKTOTAL, CKMB, CKMBINDEX, TROPONINI in the last 168 hours. BNP (last 3 results) No results for input(s): PROBNP in the last 8760 hours. HbA1C: No results for input(s): HGBA1C in the last 72 hours. CBG:  Recent Labs Lab 02/03/17 2032 02/03/17 2142 02/04/17 0358 02/04/17 0812  GLUCAP 75 82 233* 127*   Lipid Profile: No results for input(s): CHOL, HDL, LDLCALC, TRIG, CHOLHDL, LDLDIRECT in the last 72 hours. Thyroid Function Tests: No results for input(s): TSH, T4TOTAL, FREET4, T3FREE, THYROIDAB in the last 72 hours. Anemia Panel: No results for input(s): VITAMINB12, FOLATE, FERRITIN, TIBC, IRON, RETICCTPCT in the last 72 hours. Urine analysis: No results found for: COLORURINE, APPEARANCEUR, LABSPEC, PHURINE, GLUCOSEU, HGBUR, BILIRUBINUR, KETONESUR, PROTEINUR, UROBILINOGEN, NITRITE, LEUKOCYTESUR  Creatinine Clearance: Estimated Creatinine Clearance: 75.4 mL/min (by C-G formula based on SCr of 0.7 mg/dL).  Sepsis Labs: @LABRCNTIP (procalcitonin:4,lacticidven:4) )No results found for this or any previous visit (from the past 240 hour(s)).   Radiological Exams on Admission: Dg Hand Complete Right  Result Date: 02/03/2017 CLINICAL DATA:  Right hand pain/swelling EXAM: RIGHT HAND - COMPLETE 3+ VIEW COMPARISON:  None. FINDINGS: No fracture or dislocation is seen. The joint spaces are preserved. Visualized soft tissues are within normal limits. No  radiopaque foreign body is seen. IMPRESSION: No fracture, dislocation, or radiopaque foreign body is seen. Electronically Signed   By: Julian Hy M.D.   On: 02/03/2017 18:41      Assessment/Plan Active Problems:   GERD (gastroesophageal reflux disease)   Hypothyroidism   Finger infection   Type 1 diabetes (HCC)    R ring finger infection: bacterial vs fungal. Occurred 3 wks prior to admission after thorn puncture wound. Constant sx since that time w/ waxing and waning course. Worse 24hrs prior to admission. Much improved after Unasyn overnight. Seen by Dr. Grandville Silos of hand surgery.  - Continue unasyn until time of DC and then likely change to Augmentin - Pt to be seen in afternoon by Dr. Grandville Silos for additional recommendations. Surgery not likely  - Awaiting input from ID - Meloxicam  DM: type 1 - continue home insulin pump  Hypothyroid: - continue synthroid  GERD: - continue ppi    DVT prophylaxis: SCD  Code Status: full  Family Communication: none  Disposition Plan: pending eval by ID and Hand  Consults called: Hand Grandville Silos, and ID -  [place]  Admission status: obs    Waldemar Dickens MD [facility]  If 7PM-7AM, please contact night-coverage [email or web address] Password [email or web address]  02/04/2017, 10:20 AM

## 2017-02-04 NOTE — Progress Notes (Signed)
Looks remarkably good today.  Minimal swelling, redness, nearly full ROM , etc. Will likely resolve without surgical intervention.   Will leave my name/number in her d/c instructions in case need for hand surgeon develops.  Micheline Rough, MD Hand Surgery Mobile [phone]

## 2017-02-04 NOTE — Progress Notes (Signed)
Pt CBG 36. Pt rechecked with her monitor and she was 50. Pt stated that she gave herself a bolus before her additional grapes came from kitchen(which never came). Pump turned off at this time. Juice given to pt, will recheck in 15 mins.. Only c/o  headache.  Family at bedside. Will continue to monitor.

## 2017-02-05 LAB — HIV ANTIBODY (ROUTINE TESTING W REFLEX): HIV SCREEN 4TH GENERATION: NONREACTIVE

## 2017-02-09 LAB — CULTURE, BLOOD (ROUTINE X 2)
CULTURE: NO GROWTH
Culture: NO GROWTH
SPECIAL REQUESTS: ADEQUATE

## 2017-02-11 ENCOUNTER — Ambulatory Visit: Payer: BLUE CROSS/BLUE SHIELD | Admitting: Internal Medicine

## 2017-02-11 ENCOUNTER — Encounter: Payer: Self-pay | Admitting: Internal Medicine

## 2017-02-11 DIAGNOSIS — L089 Local infection of the skin and subcutaneous tissue, unspecified: Secondary | ICD-10-CM | POA: Diagnosis not present

## 2017-02-11 DIAGNOSIS — E039 Hypothyroidism, unspecified: Secondary | ICD-10-CM | POA: Diagnosis not present

## 2017-02-11 DIAGNOSIS — L700 Acne vulgaris: Secondary | ICD-10-CM | POA: Diagnosis not present

## 2017-02-11 DIAGNOSIS — L409 Psoriasis, unspecified: Secondary | ICD-10-CM | POA: Insufficient documentation

## 2017-02-11 DIAGNOSIS — E109 Type 1 diabetes mellitus without complications: Secondary | ICD-10-CM | POA: Diagnosis not present

## 2017-02-11 DIAGNOSIS — L709 Acne, unspecified: Secondary | ICD-10-CM | POA: Insufficient documentation

## 2017-02-11 MED ORDER — AMOXICILLIN-POT CLAVULANATE 875-125 MG PO TABS: 1.0000 | ORAL_TABLET | Freq: Two times a day (BID) | ORAL | 0 refills | Status: AC

## 2017-02-11 NOTE — Assessment & Plan Note (Signed)
She is still having some pain in her right hand but overall seems to be about 60-70% improved on empiric antibiotics. She may have some tenosynovitis due to the thorn injuryry but I would assume that she also had some superimposed infection. I talked to her about the difficulty of predicting what type of infection this is. I will have her continue doxycycline and amoxicillin clavulanate for 2 more days then stop both and follow-up here in 2 weeks.

## 2017-02-11 NOTE — Progress Notes (Addendum)
[facility] for Infectious Disease  Patient Active Problem List   Diagnosis Date Noted  . Finger infection 02/04/2017    Priority: High  . Tenosynovitis of right hand 02/03/2017    Priority: High  . Psoriasis 02/11/2017  . Acne 02/11/2017  . Type 1 diabetes ([place]) 02/04/2017  . GERD (gastroesophageal reflux disease)   . Hypothyroidism       Medication List        Accurate as of 02/11/17  4:26 PM. Always use your most recent med list.          amoxicillin-clavulanate 875-125 MG tablet Commonly known as:  AUGMENTIN Take 1 tablet 2 (two) times daily for 2 days by mouth.   clobetasol 0.05 % external solution Commonly known as:  TEMOVATE   doxycycline 50 MG capsule Commonly known as:  VIBRAMYCIN   gabapentin 100 MG capsule Commonly known as:  NEURONTIN   insulin pump Soln   levothyroxine 50 MCG tablet Commonly known as:  SYNTHROID, LEVOTHROID   meloxicam 15 MG tablet Commonly known as:  MOBIC Take 1 tablet (15 mg total) by mouth daily.   omeprazole 20 MG capsule Commonly known as:  PRILOSEC       Where to Get Your Medications    You can get these medications from any pharmacy   Bring a paper prescription for each of these medications  amoxicillin-clavulanate 875-125 MG tablet     Subjective: Sarah Flynn is in for her hospital follow-up visit. 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 About one month ago she was pulling weeds in her yard and suffered an injury from a thorn at the base of her right ring finger. She does not believe that any of the thorn remained in her hand. She had some redness, pain and swelling around the puncture site for about 24 hours but this then resolved spontaneously. About 3 weeks later she had sudden onset of pain in the same area associated with some swelling ring finger.   She has started on oral doxycycline on 01/31/2017 for some acne on her back. When she developed the recurrent  pain and swelling of her right hand she was admitted to the hospital on 02/03/2017. She was evaluated by Dr. Milly Jakob, hand surgeon, and my partner Dr. Carlyle Basques. They elected to treat with empiric Methicillin sulbactam. A plain x-ray was No specimens were available for culture. She remained afebrile with a normal white blood cell count and was discharged 2 days later on oral amoxicillin clavulanate. She continued the doxycycline. She says that when she was first admitted to the hospital that the pain in her right hand was "excruciating". She has improved but is still having pain at the base of the right ring finger. She had 2 episodes of nausea and vomiting this past weekend. She started taking her evening dose of amoxicillin clavulanate and doxycycline with food and that seems to have resolved. She has not had any fever, chills or sweats.   She is on an insulin pump. She recently d to change the tf pump she uses because of reasons. She feels like her blood sugars have not been under quite as good control since that time. She had her hemoglobin A1c checked today and it was up slightly to 6.9. She is on periodic Ustekinumab (STELARA) injections for her psoriasis.   Review of Systems: Review of Systems  Constitutional: Negative for chills, diaphoresis, fever, malaise/fatigue  and weight loss.  HENT: Negative for sore throat.   Respiratory: Negative for cough, sputum production and shortness of breath.   Cardiovascular: Negative for chest pain.  Gastrointestinal: Positive for nausea and vomiting. Negative for abdominal pain, diarrhea and heartburn.  Genitourinary: Negative for dysuria and frequency.  Musculoskeletal: Positive for joint pain. Negative for myalgias.  Skin: Negative for rash.       Acne on her back as noted in history of present illness.  Neurological: Negative for dizziness and headaches.    Past Medical History:  Diagnosis Date  . Diabetes mellitus without complication  ([place])   . GERD (gastroesophageal reflux disease)   . Hypothyroidism   . Thyroid disease     Social History   Tobacco Use  . Smoking status: Never Smoker  . Smokeless tobacco: Never Used  Substance Use Topics  . Alcohol use: No  . Drug use: No    Family History  Problem Relation Age of Onset  . Heart attack Paternal Grandfather     No Known Allergies  Objective: Vitals:   02/11/17 1437  BP: 120/81  Pulse: 66  Temp: 99.1 F (37.3 C)  TempSrc: Oral  Weight: 122 lb (55.3 kg)  Height: 5' 3"  (1.6 m)   Body mass index is 21.61 kg/m.  Physical Exam  Constitutional: She is oriented to person, place, and time.  She is in good spirits.  Cardiovascular: Normal rate and regular rhythm.  No murmur heard. Pulmonary/Chest: Effort normal and breath sounds normal.  Abdominal: Soft. She exhibits no distension. There is no tenderness.  Musculoskeletal: Normal range of motion. She exhibits tenderness. She exhibits no edema.  She has some tenderness on the volar aspect of her right palm at the base of her right ring finger. She has good range of motion and flexion of all fingers. There is no visible redness or swelling. She has no lymphangitic streaking up her right arm. She has no palpable adenopathy.  Neurological: She is alert and oriented to person, place, and time.  Skin:  She does have some papular acne on her back. Her psoriasis is well controlled.  Psychiatric: Mood and affect normal.    Lab Results    Problem List Items Addressed This Visit      High   Finger infection    She is still having some pain in her right hand but overall seems to be about 60-70% improved on empiric antibiotics. She may have some tenosynovitis due to the thorn injuryry but I would assume that she also had some superimposed infection. I talked to her about the difficulty of predicting what type of infection this is. I will have her continue doxycycline and amoxicillin clavulanate for 2 more days  then stop both and follow-up here in 2 weeks.        Unprioritized   Acne   Relevant Medications   doxycycline (VIBRAMYCIN) 50 MG capsule   Psoriasis       Michel Bickers, MD [facility] for Infectious [place] [phone] pager   [phone] cell 02/11/2017, 4:26 PM

## 2017-02-17 ENCOUNTER — Inpatient Hospital Stay: Payer: BLUE CROSS/BLUE SHIELD | Admitting: Internal Medicine

## 2017-02-18 NOTE — Discharge Summary (Signed)
Physician Discharge Summary  Sarah Flynn VFI:[id number] DOB: 05-May-1980 DOA: 02/03/2017  PCP: Debbrah Alar, NP  Admit date: 02/03/2017 Discharge date: 02/04/17  Admitted From: home Disposition:  Home  Recommendations for Outpatient Follow-up:  Follow up with PCP w/ Ortho as needed and with ID on 11/6  Home Health:no Equipment/Devices:none  Discharge Condition:stable CODE STATUS:full  Brief/Interim Summary: Sarah Flynn is a 36 y.o. female with medical history significant of diabetes, GERD, hypothyroidism. Patient reports right hand injury approximately 3 weeks prior to admission. Patient sustained a foreign puncture wound from a bush on her right ring finger at that time. Over the next 24 hours the area became swollen and tender. Swelling and tenderness remained relatively unchanged with some waxing and waning nature until approximately 24 hours prior to admission when he became significantly worse. Of note patient was taking doxycycline over the last 3 days for an unrelated skin condition of her back. Decreased function with the right hand due to the pain and swelling. Denies fevers, chest pain, shortness of breath, palpitations, focal neurological deficits, neck stiffness, headache, unexplained rash, night sweats.     Discharge Diagnoses:  Active Problems:   GERD (gastroesophageal reflux disease)   Hypothyroidism   Finger infection   Type 1 diabetes (HCC)  Finger infection: pt evaluated by Hand, Dr Grandville Silos to ensure no need for surgical involvement. Pt placed on Unasyn w/ significant and rapid improvement in finger pain and infeciton. Pt discharged on Augmentin w/ f/u w/ ID. ID did see pt during admission who felt that treatment regimen was appropriate and no further workup needed.   DM: pt maintained on insulin pump during admission  Hypothyroid: pt continued on homje synthroid  GERD: pt continued on home PPI   Discharge Instructions Continue augmentin F/u ID in  clinic on 11/6 F/u w/ Hand PRN.  Allergies as of 02/04/2017   No Known Allergies     Medication List    STOP taking these medications   doxycycline 50 MG capsule Commonly known as:  VIBRAMYCIN     TAKE these medications   clobetasol 0.05 % external solution Commonly known as:  TEMOVATE Apply 1 application topically daily.   insulin pump Soln Inject into the skin. Novolog   levothyroxine 50 MCG tablet Commonly known as:  SYNTHROID, LEVOTHROID Take 50 mcg by mouth daily before breakfast.   meloxicam 15 MG tablet Commonly known as:  MOBIC Take 1 tablet (15 mg total) by mouth daily.   omeprazole 20 MG capsule Commonly known as:  PRILOSEC Take 20 mg by mouth daily.      Follow-up Information    Milly Jakob, MD Follow up.   Specialty:  Orthopedic Surgery Why:  if need for hand surgeon develops Contact information: [address] [place]  [phone]          No Known Allergies  Consultations:  Hand, ID   Procedures/Studies: Dg Hand Complete Right  Result Date: 02/03/2017 CLINICAL DATA:  Right hand pain/swelling EXAM: RIGHT HAND - COMPLETE 3+ VIEW COMPARISON:  None. FINDINGS: No fracture or dislocation is seen. The joint spaces are preserved. Visualized soft tissues are within normal limits. No radiopaque foreign body is seen. IMPRESSION: No fracture, dislocation, or radiopaque foreign body is seen. Electronically Signed   By: Julian Hy M.D.   On: 02/03/2017 18:41    none   Subjective:   Discharge Exam: Vitals:   02/04/17 1007 02/04/17 1410  BP: 110/63 113/67  Pulse: 90 89  Resp: 18 18  Temp: 98.7 F (37.1 C) 98.2 F (36.8 C)  SpO2: 97% 100%   Vitals:   02/04/17 0400 02/04/17 0531 02/04/17 1007 02/04/17 1410  BP: 120/78 121/75 110/63 113/67  Pulse: 73 77 90 89  Resp: 17 18 18 18   Temp: 98.5 F (36.9 C) 98 F (36.7 C) 98.7 F (37.1 C) 98.2 F (36.8 C)  TempSrc: Oral Oral Oral Oral  SpO2: 97% 99% 97% 100%   Weight:  54 kg (119 lb)    Height:  5' 2"  (1.575 m)      General: Pt is alert, awake, not in acute distress Cardiovascular: RRR, S1/S2 +, no rubs, no gallops Respiratory: CTA bilaterally, no wheezing, no rhonchi Abdominal: Soft, NT, ND, bowel sounds + Extremities: no edema, no cyanosis    The results of significant diagnostics from this hospitalization (including imaging, microbiology, ancillary and laboratory) are listed below for reference.     Microbiology: No results found for this or any previous visit (from the past 240 hour(s)).   Labs: BNP (last 3 results) No results for input(s): BNP in the last 8760 hours. Basic Metabolic Panel: No results for input(s): NA, K, CL, CO2, GLUCOSE, BUN, CREATININE, CALCIUM, MG, PHOS in the last 168 hours. Liver Function Tests: No results for input(s): AST, ALT, ALKPHOS, BILITOT, PROT, ALBUMIN in the last 168 hours. No results for input(s): LIPASE, AMYLASE in the last 168 hours. No results for input(s): AMMONIA in the last 168 hours. CBC: No results for input(s): WBC, NEUTROABS, HGB, HCT, MCV, PLT in the last 168 hours. Cardiac Enzymes: No results for input(s): CKTOTAL, CKMB, CKMBINDEX, TROPONINI in the last 168 hours. BNP: Invalid input(s): POCBNP CBG: No results for input(s): GLUCAP in the last 168 hours. D-Dimer No results for input(s): DDIMER in the last 72 hours. Hgb A1c No results for input(s): HGBA1C in the last 72 hours. Lipid Profile No results for input(s): CHOL, HDL, LDLCALC, TRIG, CHOLHDL, LDLDIRECT in the last 72 hours. Thyroid function studies No results for input(s): TSH, T4TOTAL, T3FREE, THYROIDAB in the last 72 hours.  Invalid input(s): FREET3 Anemia work up No results for input(s): VITAMINB12, FOLATE, FERRITIN, TIBC, IRON, RETICCTPCT in the last 72 hours. Urinalysis No results found for: COLORURINE, APPEARANCEUR, LABSPEC, [place], GLUCOSEU, HGBUR, BILIRUBINUR, KETONESUR, PROTEINUR, UROBILINOGEN, NITRITE,  LEUKOCYTESUR Sepsis Labs Invalid input(s): PROCALCITONIN,  WBC,  LACTICIDVEN Microbiology No results found for this or any previous visit (from the past 240 hour(s)).   Time coordinating discharge: Over 30 minutes  SIGNED:   Waldemar Dickens, MD  [facility] 02/18/2017, 11:37 AM Pager   If 7PM-7AM, please contact night-coverage [email or web address] Password [email or web address]

## 2017-02-25 ENCOUNTER — Encounter: Payer: Self-pay | Admitting: Family

## 2017-02-25 ENCOUNTER — Ambulatory Visit (INDEPENDENT_AMBULATORY_CARE_PROVIDER_SITE_OTHER): Payer: BLUE CROSS/BLUE SHIELD | Admitting: Internal Medicine

## 2017-02-25 ENCOUNTER — Ambulatory Visit: Payer: BLUE CROSS/BLUE SHIELD | Admitting: Family

## 2017-02-25 ENCOUNTER — Encounter: Payer: Self-pay | Admitting: Internal Medicine

## 2017-02-25 DIAGNOSIS — L4050 Arthropathic psoriasis, unspecified: Secondary | ICD-10-CM | POA: Diagnosis not present

## 2017-02-25 DIAGNOSIS — K900 Celiac disease: Secondary | ICD-10-CM | POA: Diagnosis not present

## 2017-02-25 DIAGNOSIS — L089 Local infection of the skin and subcutaneous tissue, unspecified: Secondary | ICD-10-CM

## 2017-02-25 DIAGNOSIS — E039 Hypothyroidism, unspecified: Secondary | ICD-10-CM

## 2017-02-25 DIAGNOSIS — L409 Psoriasis, unspecified: Secondary | ICD-10-CM

## 2017-02-25 DIAGNOSIS — Z23 Encounter for immunization: Secondary | ICD-10-CM

## 2017-02-25 DIAGNOSIS — F40243 Fear of flying: Secondary | ICD-10-CM | POA: Insufficient documentation

## 2017-02-25 MED ORDER — USTEKINUMAB 45 MG/0.5ML ~~LOC~~ SOLN: SUBCUTANEOUS | Status: DC

## 2017-02-25 MED ORDER — ALPRAZOLAM 0.5 MG PO TABS
ORAL_TABLET | ORAL | 0 refills | Status: DC
Start: 2017-02-25 — End: 2018-06-26

## 2017-02-25 NOTE — Assessment & Plan Note (Signed)
Clinically stable on Synthroid, defer management to Endo.

## 2017-02-25 NOTE — Assessment & Plan Note (Signed)
Patient reports that her symptoms are well controlled on a gluten-free diet.  This was diagnosed based on a blood test.

## 2017-02-25 NOTE — Progress Notes (Signed)
Subjective:    Patient ID: Sarah Flynn, female    DOB: Sep 01, 1980, 36 y.o.   MRN: [id number]  HPI  Sarah Flynn is a 36 yr old female who presents today to establish care.  DM1- Diagnosed at age 1. She is followed by endo at [facility], [name] (records reviewed in care everywhere) A1C 6.9 02/11/17 at endo.   Hypothyroid- This was diagnosed about 2 years ago.  maintained on synthroid 22mg. This is being managed by endo and has been stable.   GERD- stable uses prilosec prn, gerd sxs are diet related.    Celiac Disease- [facility] Reports that this was diagnosed through blood work and she stays on a gluten free diet.    Of note, the patient recently had an infection in her hand after working in the yard. She was admitted for IV abx and saw ID.  ID note reviewed in Epic.   Psoriasis- see rheumatology (Dr. BAmil Amen [facility]. She reports that she is on stelara injections. Joint pains resolved so she likely has psoriatic arthritis.  She recently completed doxycycline.  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  Review of Systems  Constitutional: Negative for unexpected weight change.  HENT: Negative for hearing loss and rhinorrhea.   Eyes: Negative for visual disturbance.  Respiratory: Negative for cough.   Cardiovascular: Negative for leg swelling.  Gastrointestinal: Negative for constipation and diarrhea.  Genitourinary: Negative for dysuria and frequency.  Musculoskeletal: Negative for arthralgias.  Skin: Negative for rash.  Neurological: Negative for headaches.  Hematological: Negative for adenopathy.  Psychiatric/Behavioral:       Denies depression/anxiety   Past Medical History:  Diagnosis Date  . Diabetes mellitus without complication ([place]   . GERD (gastroesophageal reflux disease)   . Hypothyroidism   . Thyroid disease      Social  History   Socioeconomic History  . Marital status: Married    Spouse name: Not on file  . Number of children: Not on file  . Years of education: Not on file  . Highest education level: Not on file  Social Needs  . Financial resource strain: Not on file  . Food insecurity - worry: Not on file  . Food insecurity - inability: Not on file  . Transportation needs - medical: Not on file  . Transportation needs - non-medical: Not on file  Occupational History  . Not on file  Tobacco Use  . Smoking status: Never Smoker  . Smokeless tobacco: Never Used  Substance and Sexual Activity  . Alcohol use: No  . Drug use: No  . Sexual activity: Not on file  Other Topics Concern  . Not on file  Social History Narrative   OSales executiveat [employer]   Sister in [place]  [place]   Married    2 dogs (doberman mix and p[employer]    History reviewed. No pertinent surgical history.  Family History  Problem Relation Age of Onset  . Heart attack Paternal Grandfather   . Thyroid disease Mother        hx of hypothyroid, resolved  . Hypertension Father   . Hyperlipidemia Father   . Hearing loss Father   . Psoriasis Father     No Known Allergies  Current Outpatient Medications  on File Prior to Visit  Medication Sig Dispense Refill  . clobetasol (TEMOVATE) 0.05 % external solution Apply 1 application topically daily.   0  . gabapentin (NEURONTIN) 100 MG capsule Take 300 mg by mouth.    . Insulin Human (INSULIN PUMP) SOLN Inject into the skin. Novolog    . levothyroxine (SYNTHROID, LEVOTHROID) 50 MCG tablet Take 50 mcg by mouth daily before breakfast.    . meloxicam (MOBIC) 15 MG tablet Take 1 tablet (15 mg total) by mouth daily. 30 tablet 0  . omeprazole (PRILOSEC) 20 MG capsule Take 20 mg by mouth daily.     No current facility-administered medications on file prior to visit.     BP 121/71 (BP Location: Right Arm,  Patient Position: Sitting, Cuff Size: Small)   Pulse 69   Temp 99.1 F (37.3 C) (Oral)   Resp 16   Ht 5' 3"  (1.6 m)   Wt 120 lb 9.6 oz (54.7 kg)   LMP 02/11/2017   SpO2 100%   BMI 21.36 kg/m       Objective:   Physical Exam  Constitutional: She is oriented to person, place, and time. She appears well-developed and well-nourished.  HENT:  Head: Normocephalic and atraumatic.  Eyes: No scleral icterus.  Neck: No thyromegaly present.  Cardiovascular: Normal rate, regular rhythm and normal heart sounds.  No murmur heard. Pulmonary/Chest: Effort normal and breath sounds normal. No respiratory distress. She has no wheezes.  Musculoskeletal: She exhibits no edema.  Lymphadenopathy:    She has no cervical adenopathy.  Neurological: She is alert and oriented to person, place, and time.  Psychiatric: She has a normal mood and affect. Her behavior is normal. Judgment and thought content normal.  MS: very mild swelling of the right ring finger without erythema        Assessment & Plan:  Pneumovax given today due to diabetes diagnosis.

## 2017-02-25 NOTE — Assessment & Plan Note (Signed)
Clinically stable on Stelara.  This is being managed by dermatology.

## 2017-02-25 NOTE — Assessment & Plan Note (Signed)
Clinically stable on stelara- managed by derm.

## 2017-02-25 NOTE — Assessment & Plan Note (Signed)
She has no current signs to suggest persistent infection. She still has a little bit of pain and is probably related to inflammation of the tendon sheath. I expect this to continue to improve. She is having some flare in her back acne since stopping her chronic doxycycline. I told her that it is okay with me to restart doxycycline now. She can follow-up here as needed.

## 2017-02-25 NOTE — Progress Notes (Signed)
[facility] for Infectious Disease  Patient Active Problem List   Diagnosis Date Noted  . Finger infection 02/04/2017    Priority: High  . Tenosynovitis of right hand 02/03/2017    Priority: High  . Psoriatic arthritis ([place]) 02/25/2017  . 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 Celiac disease 02/25/2017  . Psoriasis 02/11/2017  . Acne 02/11/2017  . Type 1 diabetes ([place]) 02/04/2017  . GERD (gastroesophageal reflux disease)   . Hypothyroidism       Medication List        Accurate as of 02/25/17  5:33 PM. Always use your most recent med list.          ALPRAZolam 0.5 MG tablet Commonly known as:  XANAX One tablet by mouth prior to flying   clobetasol 0.05 % external solution Commonly known as:  TEMOVATE   gabapentin 100 MG capsule Commonly known as:  NEURONTIN   insulin pump Soln   levothyroxine 50 MCG tablet Commonly known as:  SYNTHROID, LEVOTHROID   meloxicam 15 MG tablet Commonly known as:  MOBIC Take 1 tablet (15 mg total) by mouth daily.   omeprazole 20 MG capsule Commonly known as:  PRILOSEC   Ustekinumab 45 MG/0.5ML Soln Commonly known as:  STELARA Injected every 3 months by dermatology       Where to Get Your Medications    These medications were sent to [facility], [place] - [address]  [address], [place] [address]   Phone:  [phone]   ALPRAZolam 0.5 MG tablet   Information about where to get these medications is not yet available   Ask your nurse or doctor about these medications  Ustekinumab 45 MG/0.5ML Soln     Subjective: Sarah Flynn is in for her routine follow-up visit. She completed amoxicillin and doxycycline on 02/13/2017 as empiric therapy for infection of her right hand after a recent the thorn prick. The redness and swelling of her hand has resolved but she still has some pain extending from the base of her right ring finger across the palm  toward her wrist. She had been taking meloxicam intermittently and finds that that offer some relief. She's had some flare of the acne on her back since stopping doxycycline. She remains on treatment for her psoriasis and psoriatic arthritis as well as her diabetes.  Review of Systems: Review of Systems  Constitutional: Negative for chills, diaphoresis, fever and weight loss.  Gastrointestinal: Negative for abdominal pain, diarrhea, nausea and vomiting.  Musculoskeletal: Positive for joint pain.  Skin: Positive for rash.    Past Medical History:  Diagnosis Date  . Diabetes mellitus without complication ([place])   . GERD (gastroesophageal reflux disease)   . Hypothyroidism   . Thyroid disease     Social History   Tobacco Use  . Smoking status: Never Smoker  . Smokeless tobacco: Never Used  Substance Use Topics  . Alcohol use: No  . Drug use: No    Family History  Problem Relation Age of Onset  . Heart attack Paternal Grandfather   . Thyroid disease Mother        hx of hypothyroid, resolved  . Hypertension Father   . Hyperlipidemia Father   . Hearing loss Father   . Psoriasis Father     No Known Allergies  Objective: Vitals:   02/25/17 1621  BP: 136/87  Pulse: 69  Temp: 99.4 F (37.4 C)  TempSrc: Oral  Weight: 120 lb (54.4 kg)  Height: 5' 3"  (1.6 m)   Body mass index is 21.26 kg/m.  Physical Exam  Constitutional: She is oriented to person, place, and time.  She is in good spirits.  Musculoskeletal:  There is no redness, swelling or warmth of her right hand. It is looking much better than on her initial exam with me 2 weeks ago. She does have a little bit of pain with palpation at the base of the right ring finger. She has full range of motion of all fingers and good grip strength.  Neurological: She is alert and oriented to person, place, and time.  Skin:  She has a few small pustules on her back.  Psychiatric: Mood and affect normal.    Lab Results      Problem List Items Addressed This Visit      High   Finger infection    She has no current signs to suggest persistent infection. She still has a little bit of pain and is probably related to inflammation of the tendon sheath. I expect this to continue to improve. She is having some flare in her back acne since stopping her chronic doxycycline. I told her that it is okay with me to restart doxycycline now. She can follow-up here as needed.          Michel Bickers, MD [facility] [phone] pager   [phone] cell 02/25/2017, 5:33 PM

## 2017-02-25 NOTE — Assessment & Plan Note (Signed)
A prescription was sent to her pharmacy for 2 tablets of Xanax to use prior to her upcoming flight.

## 2017-02-25 NOTE — Assessment & Plan Note (Signed)
Clinically improved.  She has follow-up this afternoon with infectious disease which she is encouraged to keep.

## 2017-02-25 NOTE — Patient Instructions (Signed)
Welcome to [facility]! 

## 2017-03-07 DIAGNOSIS — E109 Type 1 diabetes mellitus without complications: Secondary | ICD-10-CM | POA: Diagnosis not present

## 2017-03-07 DIAGNOSIS — Z794 Long term (current) use of insulin: Secondary | ICD-10-CM | POA: Diagnosis not present

## 2017-03-25 DIAGNOSIS — L409 Psoriasis, unspecified: Secondary | ICD-10-CM | POA: Diagnosis not present

## 2017-04-03 DIAGNOSIS — L4059 Other psoriatic arthropathy: Secondary | ICD-10-CM | POA: Diagnosis not present

## 2017-04-03 DIAGNOSIS — L409 Psoriasis, unspecified: Secondary | ICD-10-CM | POA: Diagnosis not present

## 2017-04-03 DIAGNOSIS — M2550 Pain in unspecified joint: Secondary | ICD-10-CM | POA: Diagnosis not present

## 2017-04-15 ENCOUNTER — Encounter: Payer: BLUE CROSS/BLUE SHIELD | Admitting: Family

## 2017-04-22 ENCOUNTER — Telehealth: Payer: Self-pay | Admitting: *Deleted

## 2017-04-22 NOTE — Telephone Encounter (Signed)
Received Dermatology Report from Dr Armanda Magic office in error, forwarded to PCP office/SLS 01/15

## 2017-05-06 DIAGNOSIS — L700 Acne vulgaris: Secondary | ICD-10-CM | POA: Diagnosis not present

## 2017-05-06 DIAGNOSIS — L409 Psoriasis, unspecified: Secondary | ICD-10-CM | POA: Diagnosis not present

## 2017-05-21 DIAGNOSIS — L409 Psoriasis, unspecified: Secondary | ICD-10-CM | POA: Diagnosis not present

## 2017-06-17 DIAGNOSIS — E109 Type 1 diabetes mellitus without complications: Secondary | ICD-10-CM | POA: Diagnosis not present

## 2017-06-17 DIAGNOSIS — E039 Hypothyroidism, unspecified: Secondary | ICD-10-CM | POA: Diagnosis not present

## 2017-06-17 DIAGNOSIS — Z9641 Presence of insulin pump (external) (internal): Secondary | ICD-10-CM | POA: Diagnosis not present

## 2017-06-26 DIAGNOSIS — E109 Type 1 diabetes mellitus without complications: Secondary | ICD-10-CM | POA: Diagnosis not present

## 2017-06-26 DIAGNOSIS — Z794 Long term (current) use of insulin: Secondary | ICD-10-CM | POA: Diagnosis not present

## 2017-07-18 DIAGNOSIS — Z79899 Other long term (current) drug therapy: Secondary | ICD-10-CM | POA: Diagnosis not present

## 2017-07-18 DIAGNOSIS — L409 Psoriasis, unspecified: Secondary | ICD-10-CM | POA: Diagnosis not present

## 2017-07-23 DIAGNOSIS — M79644 Pain in right finger(s): Secondary | ICD-10-CM | POA: Diagnosis not present

## 2017-08-05 DIAGNOSIS — L409 Psoriasis, unspecified: Secondary | ICD-10-CM | POA: Diagnosis not present

## 2017-08-05 DIAGNOSIS — L4050 Arthropathic psoriasis, unspecified: Secondary | ICD-10-CM | POA: Diagnosis not present

## 2017-08-05 DIAGNOSIS — Z79899 Other long term (current) drug therapy: Secondary | ICD-10-CM | POA: Diagnosis not present

## 2017-09-11 DIAGNOSIS — E109 Type 1 diabetes mellitus without complications: Secondary | ICD-10-CM | POA: Diagnosis not present

## 2017-10-08 DIAGNOSIS — M25561 Pain in right knee: Secondary | ICD-10-CM | POA: Diagnosis not present

## 2017-10-08 DIAGNOSIS — L4059 Other psoriatic arthropathy: Secondary | ICD-10-CM | POA: Diagnosis not present

## 2017-10-08 DIAGNOSIS — L409 Psoriasis, unspecified: Secondary | ICD-10-CM | POA: Diagnosis not present

## 2017-10-08 DIAGNOSIS — M2550 Pain in unspecified joint: Secondary | ICD-10-CM | POA: Diagnosis not present

## 2017-10-08 DIAGNOSIS — M25562 Pain in left knee: Secondary | ICD-10-CM | POA: Diagnosis not present

## 2017-10-28 DIAGNOSIS — L409 Psoriasis, unspecified: Secondary | ICD-10-CM | POA: Diagnosis not present

## 2017-10-28 DIAGNOSIS — L700 Acne vulgaris: Secondary | ICD-10-CM | POA: Diagnosis not present

## 2017-12-09 DIAGNOSIS — E109 Type 1 diabetes mellitus without complications: Secondary | ICD-10-CM | POA: Diagnosis not present

## 2017-12-23 DIAGNOSIS — Z79899 Other long term (current) drug therapy: Secondary | ICD-10-CM | POA: Diagnosis not present

## 2017-12-23 DIAGNOSIS — L400 Psoriasis vulgaris: Secondary | ICD-10-CM | POA: Diagnosis not present

## 2017-12-30 DIAGNOSIS — E109 Type 1 diabetes mellitus without complications: Secondary | ICD-10-CM | POA: Diagnosis not present

## 2017-12-30 DIAGNOSIS — I10 Essential (primary) hypertension: Secondary | ICD-10-CM | POA: Diagnosis not present

## 2017-12-30 DIAGNOSIS — E039 Hypothyroidism, unspecified: Secondary | ICD-10-CM | POA: Diagnosis not present

## 2018-02-17 DIAGNOSIS — L400 Psoriasis vulgaris: Secondary | ICD-10-CM | POA: Diagnosis not present

## 2018-02-17 DIAGNOSIS — Z79899 Other long term (current) drug therapy: Secondary | ICD-10-CM | POA: Diagnosis not present

## 2018-02-17 DIAGNOSIS — L700 Acne vulgaris: Secondary | ICD-10-CM | POA: Diagnosis not present

## 2018-03-09 DIAGNOSIS — E109 Type 1 diabetes mellitus without complications: Secondary | ICD-10-CM | POA: Diagnosis not present

## 2018-03-24 ENCOUNTER — Encounter (HOSPITAL_COMMUNITY): Payer: Self-pay | Admitting: Internal Medicine

## 2018-03-24 ENCOUNTER — Emergency Department (HOSPITAL_COMMUNITY)
Admission: EM | Admit: 2018-03-24 | Discharge: 2018-03-24 | Disposition: A | Discharge status: Home / Self Care | Payer: 59 | Attending: Emergency Medicine | Admitting: Emergency Medicine

## 2018-03-24 DIAGNOSIS — R55 Syncope and collapse: Secondary | ICD-10-CM | POA: Diagnosis not present

## 2018-03-24 DIAGNOSIS — R404 Transient alteration of awareness: Secondary | ICD-10-CM | POA: Diagnosis not present

## 2018-03-24 DIAGNOSIS — E039 Hypothyroidism, unspecified: Secondary | ICD-10-CM | POA: Diagnosis not present

## 2018-03-24 DIAGNOSIS — Z794 Long term (current) use of insulin: Secondary | ICD-10-CM | POA: Insufficient documentation

## 2018-03-24 DIAGNOSIS — E10649 Type 1 diabetes mellitus with hypoglycemia without coma: Secondary | ICD-10-CM | POA: Insufficient documentation

## 2018-03-24 DIAGNOSIS — E162 Hypoglycemia, unspecified: Secondary | ICD-10-CM

## 2018-03-24 DIAGNOSIS — E161 Other hypoglycemia: Secondary | ICD-10-CM | POA: Diagnosis not present

## 2018-03-24 DIAGNOSIS — E11649 Type 2 diabetes mellitus with hypoglycemia without coma: Secondary | ICD-10-CM | POA: Diagnosis not present

## 2018-03-24 LAB — URINALYSIS, ROUTINE W REFLEX MICROSCOPIC
BACTERIA UA: NONE SEEN
BILIRUBIN URINE: NEGATIVE
Glucose, UA: NEGATIVE mg/dL
Ketones, ur: NEGATIVE mg/dL
Leukocytes, UA: NEGATIVE
Nitrite: NEGATIVE
Protein, ur: NEGATIVE mg/dL
SPECIFIC GRAVITY, URINE: 1.004 — AB (ref 1.005–1.030)
pH: 6 (ref 5.0–8.0)

## 2018-03-24 LAB — COMPREHENSIVE METABOLIC PANEL
ALT: 21 U/L (ref 0–44)
AST: 33 U/L (ref 15–41)
Albumin: 4.2 g/dL (ref 3.5–5.0)
Alkaline Phosphatase: 29 U/L — ABNORMAL LOW (ref 38–126)
Anion gap: 14 (ref 5–15)
BILIRUBIN TOTAL: 0.3 mg/dL (ref 0.3–1.2)
BUN: 12 mg/dL (ref 6–20)
CALCIUM: 9 mg/dL (ref 8.9–10.3)
CO2: 22 mmol/L (ref 22–32)
CREATININE: 0.85 mg/dL (ref 0.44–1.00)
Chloride: 105 mmol/L (ref 98–111)
GFR calc Af Amer: >60 mL/min (ref >60)
Glucose, Bld: 60 mg/dL — ABNORMAL LOW (ref 70–99)
Potassium: 3.6 mmol/L (ref 3.5–5.1)
Sodium: 141 mmol/L (ref 135–145)
Total Protein: 7.8 g/dL (ref 6.5–8.1)

## 2018-03-24 LAB — CBG MONITORING, ED
GLUCOSE-CAPILLARY: 80 mg/dL (ref 70–99)
Glucose-Capillary: 60 mg/dL — ABNORMAL LOW (ref 70–99)
Glucose-Capillary: 307 mg/dL — ABNORMAL HIGH (ref 70–99)

## 2018-03-24 LAB — POC URINE PREG, ED: PREG TEST UR: NEGATIVE

## 2018-03-24 NOTE — ED Provider Notes (Signed)
[facility] EMERGENCY DEPARTMENT Provider Note   CSN: [id number] Arrival date & time: 03/24/18  1025     History   Chief Complaint Chief Complaint  Patient presents with  . Hypoglycemia    HPI Sarah Flynn is a 37 y.o. female.  37yo F w/ PMH including IDDM, GERD, psoriatic arthritis, hypothyroidism, celiac disease who p/w hypoglycemia and syncope. 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 She was walking to get snack and next thing she remembers, the doctor was standing over her trying to wake her up. Initial BG 20; EMS gave D10 bolus and improved to 80s. She notes she has been having problems with her basal rate being too strong recently. This morning she gave herself less than recommended bolus dose at breakfast, then later felt like her BG was getting too high so she gave a bolus and may have overshot it. She denies any recent illness including vomiting or [name] sx. No recent major changes in weight. She paused her insulin pump after the passing out episode occurred but she has since restarted it. She is currently on menstrual period which often makes her not eat as much due to cramping.  The history is provided by the patient.  Hypoglycemia    Past Medical History:  Diagnosis Date  . Diabetes mellitus without complication ([place])   . GERD (gastroesophageal reflux disease)   . Hypothyroidism   . Thyroid disease     Patient Active Problem List   Diagnosis Date Noted  . Psoriatic arthritis ([place]) 02/25/2017  . 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 Celiac disease 02/25/2017  . Psoriasis 02/11/2017  . Acne 02/11/2017  . Finger infection 02/04/2017  . Type 1 diabetes ([place]) 02/04/2017  . Tenosynovitis of right hand 02/03/2017  . GERD (gastroesophageal reflux disease)   . Hypothyroidism     History reviewed. No pertinent surgical history.   OB History   No obstetric history on file.       Home Medications    Prior to Admission medications   Medication Sig Start Date End Date Taking? Authorizing Provider  gabapentin (NEURONTIN) 100 MG capsule Take 100 mg by mouth at bedtime as needed (nerve pain).  03/08/16  Yes [provider]  ibuprofen (ADVIL,MOTRIN) 200 MG tablet Take 600 mg by mouth every 6 (six) hours as needed for moderate pain.   Yes [provider]  Insulin Human (INSULIN PUMP) SOLN Inject 26-35 each into the skin continuous. Humalog   Yes [provider]  omeprazole (PRILOSEC) 20 MG capsule Take 20 mg by mouth as needed (acid reflux/heartburn).    Yes [provider]  ALPRAZolam Duanne Moron) 0.5 MG tablet One tablet by mouth prior to flying Patient not taking: Reported on 03/24/2018 02/25/17   Debbrah Alar, NP  meloxicam (MOBIC) 15 MG tablet Take 1 tablet (15 mg total) by mouth daily. Patient not taking: Reported on 03/24/2018 02/05/17   Waldemar Dickens, MD  Ustekinumab Delsa Grana) 45 MG/0.5ML SOLN Injected every 3 months by dermatology Patient not taking: Reported on 03/24/2018 02/25/17   Debbrah Alar, NP    Family History Family History  Problem Relation Age of Onset  . Heart attack Paternal Grandfather   . Thyroid disease Mother        hx of hypothyroid, resolved  . Hypertension Father   . Hyperlipidemia Father   . Hearing loss Father   .  Psoriasis Father     Social History Social History   Tobacco Use  . Smoking status: Never Smoker  . Smokeless tobacco: Never Used  Substance Use Topics  . Alcohol use: No  . Drug use: No     Allergies   Patient has no known allergies.   Review of Systems Review of Systems All other systems reviewed and are negative except that which was mentioned in HPI   Physical Exam Updated Vital Signs BP (!) 146/119   Pulse 95   Resp (!) 28   SpO2 97%   Physical Exam Vitals signs and nursing note reviewed.  Constitutional:      General: She is not in acute  distress.    Appearance: She is well-developed.  HENT:     Head: Normocephalic and atraumatic.  Eyes:     Conjunctiva/sclera: Conjunctivae normal.  Neck:     Musculoskeletal: Neck supple.  Cardiovascular:     Rate and Rhythm: Normal rate and regular rhythm.     Heart sounds: Normal heart sounds. No murmur.  Pulmonary:     Effort: Pulmonary effort is normal.     Breath sounds: Normal breath sounds.  Abdominal:     General: Bowel sounds are normal. There is no distension.     Palpations: Abdomen is soft.     Tenderness: There is no abdominal tenderness.  Skin:    General: Skin is warm and dry.  Neurological:     Mental Status: She is alert and oriented to person, place, and time.     Comments: Fluent speech Mild tremulousness  Psychiatric:        Judgment: Judgment normal.      ED Treatments / Results  Labs (all labs ordered are listed, but only abnormal results are displayed) Labs Reviewed  COMPREHENSIVE METABOLIC PANEL - Abnormal; Notable for the following components:      Result Value   Glucose, Bld 60 (*)    Alkaline Phosphatase 29 (*)    All other components within normal limits  URINALYSIS, ROUTINE W REFLEX MICROSCOPIC - Abnormal; Notable for the following components:   Color, Urine STRAW (*)    Specific Gravity, Urine 1.004 (*)    Hgb urine dipstick MODERATE (*)    All other components within normal limits  CBG MONITORING, ED - Abnormal; Notable for the following components:   Glucose-Capillary 60 (*)    All other components within normal limits  CBG MONITORING, ED - Abnormal; Notable for the following components:   Glucose-Capillary 307 (*)    All other components within normal limits  CBG MONITORING, ED  POC URINE PREG, ED  CBG MONITORING, ED  CBG MONITORING, ED  CBG MONITORING, ED  CBG MONITORING, ED    EKG EKG Interpretation  Date/Time:  Tuesday March 24 2018 10:47:57 EST Ventricular Rate:  76 PR Interval:    QRS Duration: 96 QT  Interval:  390 QTC Calculation: 439 R Axis:   79 Text Interpretation:  Sinus rhythm Low voltage, precordial leads No previous ECGs available EKG WITHIN NORMAL LIMITS Confirmed by Theotis Burrow [phone]) on 03/24/2018 11:26:10 AM   Radiology No results found.  Procedures Procedures (including critical care time)  Medications Ordered in ED Medications - No data to display   Initial Impression / Assessment and Plan / ED Course  I have reviewed the triage vital signs and the nursing notes.  Pertinent labs that were available during my care of the patient were reviewed by me and considered in  my medical decision making (see chart for details).     Alert here, NAD. BG 60. Gave juice and crackers, paused insulin pump. Repeat BG 80. Pt given lunch meal.  Basic labs reassuring w/ no evidence of infection, DKA, or dehydration. Pt tolerating PO, repeat BG 307.  PT follows w/ Dr. Peri Jefferson at [facility]. Contacted Autumn Jones by phone and we had discussion together with patient. As instructed, she adjusted her basal and bolus rates for more conservative management in effort to prevent hypoglycemic episodes. Restarted pump on this regimen and observed the patient.  Final exam, she remains well-appearing.  Blood glucose is 400 however with her pump restarted I anticipate that this will continue to decrease throughout this afternoon into this evening.  Have encouraged her to watch blood glucose closely over the next 1 to 2 days and to drink plenty of fluids today.  She will contact the endocrinology clinic for f/u next week.  Extensively reviewed return precautions and she voiced understanding. Final Clinical Impressions(s) / ED Diagnoses   Final diagnoses:  Hypoglycemia  Syncope, unspecified syncope type    ED Discharge Orders    None       Yuval Nolet, Wenda Overland, MD 03/24/18 [phone]

## 2018-03-24 NOTE — ED Triage Notes (Signed)
Pt BIB [facility] from work with hypoglycemia. Pt remembers walking to get snack, then waking up to staff at doctors office attempting to help her. Given glucose tablet by coworkers prior to EMS arrival. Initial CBG for EMS 20. Given 12.5 of D10% by EMS. Hx type 1 diabetes.

## 2018-03-24 NOTE — Discharge Instructions (Addendum)
Contact Sarah Flynn's clinic for follow up. Continue to eat and drink per usual diabetic diet with extra hydration today and close monitoring of glucose over the next 24 hours. Return to ER for any persistent low blood sugar, confusion, passing out episodes, or severe vomiting.

## 2018-03-24 NOTE — ED Notes (Signed)
Patient verbalizes understanding of discharge instructions. Opportunity for questioning and answers were provided. Armband removed by staff, pt discharged from ED.  

## 2018-03-24 NOTE — ED Notes (Signed)
Patient ambulatory to bathroom with steady gait at this time 

## 2018-03-24 NOTE — ED Notes (Signed)
Pt given apple juice, graham crackers, and peanut butter.

## 2018-03-31 DIAGNOSIS — E109 Type 1 diabetes mellitus without complications: Secondary | ICD-10-CM | POA: Diagnosis not present

## 2018-04-03 ENCOUNTER — Telehealth: Payer: Self-pay

## 2018-04-03 NOTE — Telephone Encounter (Signed)
Copied from [place] [id number]. Topic: Appointment Scheduling - Transfer of Care >> Apr 03, 2018  9:48 AM [name] wrote: Pt is requesting to transfer FROM: Sarah Flynn Pt is requesting to transfer TO: [name] Reason for requested transfer: Referral from another patient and would like to see same Dr as husband  Send CRM to patient's current PCP (transferring FROM).

## 2018-04-03 NOTE — Telephone Encounter (Signed)
OK w me.  

## 2018-04-29 DIAGNOSIS — E109 Type 1 diabetes mellitus without complications: Secondary | ICD-10-CM | POA: Diagnosis not present

## 2018-04-29 DIAGNOSIS — Z794 Long term (current) use of insulin: Secondary | ICD-10-CM | POA: Diagnosis not present

## 2018-05-04 DIAGNOSIS — L400 Psoriasis vulgaris: Secondary | ICD-10-CM | POA: Diagnosis not present

## 2018-05-04 DIAGNOSIS — Z79899 Other long term (current) drug therapy: Secondary | ICD-10-CM | POA: Diagnosis not present

## 2018-05-07 DIAGNOSIS — L409 Psoriasis, unspecified: Secondary | ICD-10-CM | POA: Diagnosis not present

## 2018-05-07 DIAGNOSIS — L4059 Other psoriatic arthropathy: Secondary | ICD-10-CM | POA: Diagnosis not present

## 2018-05-07 DIAGNOSIS — M2550 Pain in unspecified joint: Secondary | ICD-10-CM | POA: Diagnosis not present

## 2018-05-19 DIAGNOSIS — B373 Candidiasis of vulva and vagina: Secondary | ICD-10-CM | POA: Diagnosis not present

## 2018-06-04 DIAGNOSIS — E109 Type 1 diabetes mellitus without complications: Secondary | ICD-10-CM | POA: Diagnosis not present

## 2018-06-26 ENCOUNTER — Ambulatory Visit (INDEPENDENT_AMBULATORY_CARE_PROVIDER_SITE_OTHER): Payer: 59 | Admitting: Family Medicine

## 2018-06-26 ENCOUNTER — Other Ambulatory Visit: Payer: Self-pay | Admitting: Family Medicine

## 2018-06-26 ENCOUNTER — Encounter: Payer: 59 | Admitting: Family Medicine

## 2018-06-26 ENCOUNTER — Other Ambulatory Visit: Payer: Self-pay

## 2018-06-26 ENCOUNTER — Encounter: Payer: Self-pay | Admitting: Family Medicine

## 2018-06-26 VITALS — BP 120/84 | HR 78 | Temp 98.2°F | Ht 62.5 in | Wt 123.4 lb

## 2018-06-26 DIAGNOSIS — E109 Type 1 diabetes mellitus without complications: Secondary | ICD-10-CM | POA: Diagnosis not present

## 2018-06-26 DIAGNOSIS — L709 Acne, unspecified: Secondary | ICD-10-CM

## 2018-06-26 DIAGNOSIS — E611 Iron deficiency: Secondary | ICD-10-CM | POA: Diagnosis not present

## 2018-06-26 DIAGNOSIS — Z1239 Encounter for other screening for malignant neoplasm of breast: Secondary | ICD-10-CM

## 2018-06-26 DIAGNOSIS — E039 Hypothyroidism, unspecified: Secondary | ICD-10-CM

## 2018-06-26 DIAGNOSIS — L4050 Arthropathic psoriasis, unspecified: Secondary | ICD-10-CM

## 2018-06-26 DIAGNOSIS — Z794 Long term (current) use of insulin: Secondary | ICD-10-CM

## 2018-06-26 DIAGNOSIS — E119 Type 2 diabetes mellitus without complications: Secondary | ICD-10-CM

## 2018-06-26 DIAGNOSIS — IMO0001 Reserved for inherently not codable concepts without codable children: Secondary | ICD-10-CM | POA: Insufficient documentation

## 2018-06-26 DIAGNOSIS — T7840XA Allergy, unspecified, initial encounter: Secondary | ICD-10-CM

## 2018-06-26 LAB — MICROALBUMIN / CREATININE URINE RATIO
Creatinine,U: 16.4 mg/dL
Microalb Creat Ratio: 4.3 mg/g (ref 0.0–30.0)
Microalb, Ur: <0.7 mg/dL (ref 0.0–1.9)

## 2018-06-26 LAB — CBC
HCT: 40.3 % (ref 36.0–46.0)
Hemoglobin: 13.7 g/dL (ref 12.0–15.0)
MCHC: 33.9 g/dL (ref 30.0–36.0)
MCV: 90.2 fl (ref 78.0–100.0)
Platelets: 282 10*3/uL (ref 150.0–400.0)
RBC: 4.46 Mil/uL (ref 3.87–5.11)
RDW: 13.2 % (ref 11.5–15.5)
WBC: 4.9 10*3/uL (ref 4.0–10.5)

## 2018-06-26 LAB — COMPREHENSIVE METABOLIC PANEL
ALT: 18 U/L (ref 0–35)
AST: 17 U/L (ref 0–37)
Albumin: 4.3 g/dL (ref 3.5–5.2)
Alkaline Phosphatase: 34 U/L — ABNORMAL LOW (ref 39–117)
BUN: 14 mg/dL (ref 6–23)
CO2: 29 meq/L (ref 19–32)
Calcium: 9.6 mg/dL (ref 8.4–10.5)
Chloride: 101 mEq/L (ref 96–112)
Creatinine, Ser: 0.78 mg/dL (ref 0.40–1.20)
GFR: 81.75 mL/min (ref >60.00)
Glucose, Bld: 124 mg/dL — ABNORMAL HIGH (ref 70–99)
Potassium: 4.6 mEq/L (ref 3.5–5.1)
Sodium: 137 mEq/L (ref 135–145)
Total Bilirubin: 0.6 mg/dL (ref 0.2–1.2)
Total Protein: 7.6 g/dL (ref 6.0–8.3)

## 2018-06-26 LAB — T4, FREE: FREE T4: 0.83 ng/dL (ref 0.60–1.60)

## 2018-06-26 LAB — IBC + FERRITIN
Ferritin: 75.6 ng/mL (ref 10.0–291.0)
Iron: 121 ug/dL (ref 42–145)
Saturation Ratios: 41.4 % (ref 20.0–50.0)
Transferrin: 209 mg/dL — ABNORMAL LOW (ref 212.0–360.0)

## 2018-06-26 LAB — TSH: TSH: 2 u[IU]/mL (ref 0.35–4.50)

## 2018-06-26 MED ORDER — LEVOCETIRIZINE DIHYDROCHLORIDE 5 MG PO TABS: 5.0000 mg | ORAL_TABLET | Freq: Every evening | ORAL | 2 refills | Status: DC

## 2018-06-26 MED ORDER — LEVOTHYROXINE SODIUM 25 MCG PO TABS: 25.0000 ug | ORAL_TABLET | Freq: Every day | ORAL | 2 refills | Status: DC

## 2018-06-26 MED ORDER — FLUTICASONE PROPIONATE 50 MCG/ACT NA SUSP: 2.0000 | Freq: Every day | NASAL | 2 refills | Status: DC

## 2018-06-26 NOTE — Progress Notes (Signed)
CC: TOC  Subjective: Patient is a 38 y.o. female here for TOC.  Hx of hypothyroid. Not currently on medication. Had been on in past, then levels normalized. Hx of AI disease. Levels have not been checked recently.  Hx of Fe def. Unsure if she needs to have levels checked. Has never had infusions.  Hx of DM I. Has not been having urine checked. Follows with endo, is on pump. Diet is healthy, she exercises routinely. Just turned 38, is not on statin.   Needs mammogram.  Hx of acne on back, uses doxy, stress seems to exacerbate it.   Hx of psoriatic arthritis. Follows with derm. Not doing well with biologics. Also seems rheum.   Hx of runny nose. Interested in seeing an allergist.   ROS: Heart: Denies chest pain  Lungs: Denies SOB  Skin: +acne on back MSK: +joint pain GI: +intermittent abd pain Neuro: +intermittent numbness/tingling Endo: +fatigue at night Nose: +rhinorrhea Const: No fevers Heme: No areas of easy bleeding  Past Medical History:  Diagnosis Date  . Celiac disease   . GERD (gastroesophageal reflux disease)   . Hypothyroidism   . Iron deficiency   . Psoriatic arthritis ([place])   . Type 1 diabetes mellitus ([place])    History reviewed. No pertinent surgical history.  No Known Allergies  Current Outpatient Medications on File Prior to Visit  Medication Sig Dispense Refill  . doxycycline (VIBRAMYCIN) 50 MG capsule Take 50 mg by mouth daily.    . Insulin Human (INSULIN PUMP) SOLN Inject 26-35 each into the skin continuous. Humalog    . meloxicam (MOBIC) 15 MG tablet Take 15 mg by mouth daily.    Marland Kitchen omeprazole (PRILOSEC) 20 MG capsule Take 20 mg by mouth as needed (acid reflux/heartburn).     . gabapentin (NEURONTIN) 100 MG capsule Take 100 mg by mouth at bedtime as needed (nerve pain).      Objective: BP 120/84 (BP Location: Left Arm, Patient Position: Sitting, Cuff Size: Normal)   Pulse 78   Temp 98.2 F (36.8 C) (Oral)   Ht 5' 2.5" (1.588 m)   Wt 123 lb 6  oz (56 kg)   SpO2 97%   BMI 22.21 kg/m  General: Awake, appears stated age HEENT: MMM, EOMi Neck: Symmetric, supple, no masses or nodules noted over thyroid Heart: RRR, no LE edema, no bruits Lungs: CTAB, no rales, wheezes or rhonchi. No accessory muscle use Psych: Age appropriate judgment and insight, normal affect and mood  Assessment and Plan: Iron deficiency - Plan: CBC, IBC + Ferritin  Hypothyroidism, unspecified type - Plan: T4, free, TSH  Type 1 diabetes mellitus without complication (HCC) - Plan: Comprehensive metabolic panel, Microalbumin / creatinine urine ratio  Screening for breast cancer - Plan: MM DIGITAL SCREENING BILATERAL  Allergic state, initial encounter - Plan: Ambulatory referral to Allergy, levocetirizine (XYZAL) 5 MG tablet, fluticasone (FLONASE) 50 MCG/ACT nasal spray  Psoriatic arthritis (HCC) - Plan: meloxicam (MOBIC) 15 MG tablet  Acne, unspecified acne type - Plan: doxycycline (VIBRAMYCIN) 50 MG capsule  Ck Fe, may need Fe infusions, but rec'd PO OTC supp for now. Ck thyroid levels. If T4 on low end of normal, she would be open to go back on low dose supplementation. Ck CMP and microalb/Cr, discuss statin with her endo team.  Start INCS and PO antihistamine. Will refer to allergy team for testing and further management.  Order mammogram as she recently turned 41. Cont other meds. F/u in 6 mo for CPE.  The patient voiced understanding and agreement to the plan.  [place], DO 06/26/18  11:06 AM

## 2018-06-26 NOTE — Patient Instructions (Addendum)
Give [place] 2-3 business days to get the results of your labs back.   Someone should reach out regarding your mammogram.   If you do not hear anything about your referral in the next 1-2 weeks, call our office and ask for an update.  Ask your endocrinologist if you would do well on a statin (cholesterol lowering medication).  Let us know if you need anything.

## 2018-06-30 DIAGNOSIS — E109 Type 1 diabetes mellitus without complications: Secondary | ICD-10-CM | POA: Diagnosis not present

## 2018-06-30 DIAGNOSIS — Z794 Long term (current) use of insulin: Secondary | ICD-10-CM | POA: Diagnosis not present

## 2018-06-30 DIAGNOSIS — E039 Hypothyroidism, unspecified: Secondary | ICD-10-CM | POA: Diagnosis not present

## 2018-07-14 ENCOUNTER — Ambulatory Visit: Payer: Self-pay | Admitting: Pediatrics

## 2018-07-21 ENCOUNTER — Ambulatory Visit: Payer: Self-pay | Admitting: Allergy

## 2018-07-28 DIAGNOSIS — Z794 Long term (current) use of insulin: Secondary | ICD-10-CM | POA: Diagnosis not present

## 2018-07-28 DIAGNOSIS — E109 Type 1 diabetes mellitus without complications: Secondary | ICD-10-CM | POA: Diagnosis not present

## 2018-08-03 ENCOUNTER — Encounter: Payer: Self-pay | Admitting: Pediatrics

## 2018-08-03 ENCOUNTER — Other Ambulatory Visit: Payer: Self-pay

## 2018-08-03 ENCOUNTER — Ambulatory Visit (INDEPENDENT_AMBULATORY_CARE_PROVIDER_SITE_OTHER): Payer: 59 | Admitting: Pediatrics

## 2018-08-03 VITALS — BP 120/82 | HR 66 | Temp 98.3°F | Resp 16 | Ht 63.0 in | Wt 124.2 lb

## 2018-08-03 DIAGNOSIS — Z794 Long term (current) use of insulin: Secondary | ICD-10-CM

## 2018-08-03 DIAGNOSIS — IMO0001 Reserved for inherently not codable concepts without codable children: Secondary | ICD-10-CM

## 2018-08-03 DIAGNOSIS — E119 Type 2 diabetes mellitus without complications: Secondary | ICD-10-CM | POA: Diagnosis not present

## 2018-08-03 DIAGNOSIS — J3089 Other allergic rhinitis: Secondary | ICD-10-CM

## 2018-08-03 DIAGNOSIS — L4059 Other psoriatic arthropathy: Secondary | ICD-10-CM | POA: Diagnosis not present

## 2018-08-03 DIAGNOSIS — K219 Gastro-esophageal reflux disease without esophagitis: Secondary | ICD-10-CM | POA: Diagnosis not present

## 2018-08-03 DIAGNOSIS — L409 Psoriasis, unspecified: Secondary | ICD-10-CM | POA: Diagnosis not present

## 2018-08-03 DIAGNOSIS — M2550 Pain in unspecified joint: Secondary | ICD-10-CM | POA: Diagnosis not present

## 2018-08-03 NOTE — Progress Notes (Signed)
[name] [id number] Dept: [id number]  New Patient Note  Patient ID: Sarah Flynn, female    DOB: 11-03-1980  Age: 38 y.o. MRN: [id number] Date of Office Visit: 08/03/2018 Referring provider: Shelda Pal, [place], [place] [address]    Chief Complaint: Nasal Congestion (2-3 years that develops into coughing)  HPI Nicoletta Ba presents for evaluation of a chronic postnasal drainage for about 2 years.  Her symptoms have been worse in the past 6  months.  She had 2 or 3 sinus infections last year and one sinus infection this year.  She has aggravation of her symptoms on exposure to dust and cigarette smoke.  She has itchy eyes around animals.  Her symptoms are worse after she eats.  Occasionally she has sinus headaches.  She has not had asthmatic symptoms but occasionally has a cough when the postnasal drainage is severe.  She has never had asthmatic symptoms.  She has a history of psoriasis.  There is no history of eczema or chronic urticaria.  She has had insulin-dependent diabetes for about 30 years.  She is hypothyroid.  She has had positive antibodies for celiac disease but inconclusive endoscopic biopsies  Review of Systems  Constitutional: Negative.   HENT:       Nasal congestion and postnasal drainage for 2 or 3 years  Eyes: Negative.   Respiratory: Negative.   Cardiovascular: Negative.   Gastrointestinal:       Gastroesophageal reflux.  She had positive celiac antibodies but inconclusive biopsies  Genitourinary: Negative.   Musculoskeletal: Negative.   Skin:       History of psoriasis  Neurological: Negative.   Endo/Heme/Allergies:       Insulin-dependent diabetes for 30 years.  Hypothyroidism  Psychiatric/Behavioral: Negative.     Outpatient Encounter Medications as of 08/03/2018  Medication Sig  . clobetasol (TEMOVATE) 0.05 % external solution Apply 1 application topically 2 (two) times daily.  Marland Kitchen gabapentin  (NEURONTIN) 100 MG capsule Take 100 mg by mouth at bedtime as needed (nerve pain).   Marland Kitchen HUMALOG 100 UNIT/ML injection   . levocetirizine (XYZAL) 5 MG tablet Take 1 tablet (5 mg total) by mouth every evening.  Marland Kitchen levothyroxine (SYNTHROID, LEVOTHROID) 25 MCG tablet Take 1 tablet (25 mcg total) by mouth daily before breakfast.  . meloxicam (MOBIC) 15 MG tablet Take 15 mg by mouth daily. For joint pain  . omeprazole (PRILOSEC) 20 MG capsule Take 20 mg by mouth as needed (acid reflux/heartburn).   . fluticasone (FLONASE) 50 MCG/ACT nasal spray Place 2 sprays into both nostrils daily. (Patient not taking: Reported on 08/03/2018)  . [DISCONTINUED] doxycycline (VIBRAMYCIN) 50 MG capsule Take 50 mg by mouth daily.  . [DISCONTINUED] Insulin Human (INSULIN PUMP) SOLN Inject 26-35 each into the skin continuous. Humalog  . [DISCONTINUED] meloxicam (MOBIC) 15 MG tablet Take 15 mg by mouth daily.   No facility-administered encounter medications on file as of 08/03/2018.      Drug Allergies:  No Known Allergies  Family History: Asyria's family history includes Eczema in her mother; Hearing loss in her father; Heart attack in her paternal grandfather; Hyperlipidemia in her father; Hypertension in her father; Psoriasis in her father; Thyroid disease in her mother..  Family history is negative for asthma, hayfever, sinus problems, angioedema, eczema, hives, food allergies, recurrent infections.  Social and environmental.  She has 2 dogs and 1 cat in the home.  Her husband smokes outside.  She has not smoked cigarettes in the past.  She is an ophthalmic technician.  Physical Exam: BP 120/82   Pulse 66   Temp 98.3 F (36.8 C) (Oral)   Resp 16   Ht 5' 3"  (1.6 m)   Wt 124 lb 3.2 oz (56.3 kg)   SpO2 97%   BMI 22.00 kg/m    Physical Exam Constitutional:      Appearance: Normal appearance. She is normal weight.  HENT:     Head:     Comments: Eyes normal.  Ears normal.  Nose normal.  Pharynx normal. Neck:      Musculoskeletal: Neck supple.     Comments: No thyromegaly Cardiovascular:     Comments: S1-S2 normal no murmurs Pulmonary:     Comments: Clear to percussion and auscultation Abdominal:     General: Abdomen is flat.     Palpations: Abdomen is soft.     Tenderness: There is no abdominal tenderness.     Comments: No hepatosplenomegaly  Lymphadenopathy:     Cervical: No cervical adenopathy.  Skin:    Comments: Clear  Neurological:     General: No focal deficit present.     Mental Status: She is alert and oriented to person, place, and time.  Psychiatric:        Mood and Affect: Mood normal.        Behavior: Behavior normal.        Thought Content: Thought content normal.        Judgment: Judgment normal.     Diagnostics:    Assessment  Assessment and Plan: 1. Other allergic rhinitis   2. Insulin dependent diabetes mellitus ([place])   3. Gastroesophageal reflux disease without esophagitis     No orders of the defined types were placed in this encounter.   Patient Instructions  Stop Xyzal Return in 3 days to do allergy skin testing   Return in about 3 days (around 08/06/2018).   Thank you for the opportunity to care for this patient.  Please do not hesitate to contact me with questions.  Penne Lash, M.D.  [facility] [address] [place], [place] [address] [phone]

## 2018-08-03 NOTE — Patient Instructions (Signed)
Stop Xyzal Return in 3 days to do allergy skin testing

## 2018-08-05 ENCOUNTER — Ambulatory Visit (INDEPENDENT_AMBULATORY_CARE_PROVIDER_SITE_OTHER): Payer: 59 | Admitting: Pediatrics

## 2018-08-05 ENCOUNTER — Other Ambulatory Visit: Payer: Self-pay

## 2018-08-05 ENCOUNTER — Encounter: Payer: Self-pay | Admitting: Pediatrics

## 2018-08-05 VITALS — BP 110/60 | HR 80 | Temp 99.0°F | Resp 20

## 2018-08-05 DIAGNOSIS — K219 Gastro-esophageal reflux disease without esophagitis: Secondary | ICD-10-CM

## 2018-08-05 DIAGNOSIS — K900 Celiac disease: Secondary | ICD-10-CM

## 2018-08-05 DIAGNOSIS — J3089 Other allergic rhinitis: Secondary | ICD-10-CM

## 2018-08-05 DIAGNOSIS — IMO0001 Reserved for inherently not codable concepts without codable children: Secondary | ICD-10-CM

## 2018-08-05 DIAGNOSIS — Z794 Long term (current) use of insulin: Secondary | ICD-10-CM

## 2018-08-05 DIAGNOSIS — E119 Type 2 diabetes mellitus without complications: Secondary | ICD-10-CM

## 2018-08-05 DIAGNOSIS — L4050 Arthropathic psoriasis, unspecified: Secondary | ICD-10-CM

## 2018-08-05 NOTE — Patient Instructions (Addendum)
Environmental control of dust and mold Xyzal 5 mg-take 1 tablet in the morning for a runny nose or drainage Nasal saline rinses followed by Fluticasone 2 sprays per nostril once a day  Azelastine 2 sprays per nostril twice a day if needed for a sinus headache Atrovent 0.03% - 2 sprays per nostril 20 to 30 minutes before eating to see if it helps with the drainage.  You may use 2 sprays 3 times a day for runny nose or drainage If the drainage is worse at night, you may take Benadryl 25 mg at night Continue on your other medications  Call us if you are not doing well on this treatment plan on this treatment plan

## 2018-08-05 NOTE — Progress Notes (Signed)
[place] [id number] Dept: [phone]  FOLLOW UP NOTE  Patient ID: Sarah Flynn, female    DOB: 1980-07-28  Age: 38 y.o. MRN: [id number] Date of Office Visit: 08/05/2018  Assessment  Chief Complaint: Allergy Testing  HPI Sarah Flynn presents for allergy skin testing.  She stopped Xyzal 3 days ago.  According to her Xyzal does help.  She reported that she has very high antibodies for celiac disease.  She has been avoiding gluten and feels much better.  Her postnasal drainage appears to be worse after eating.  Her diabetes is well controlled.  Occasionally she has sinus headaches.   Drug Allergies:  No Known Allergies  Physical Exam: BP 110/60 (BP Location: Left Arm, Patient Position: Sitting, Cuff Size: Normal)   Pulse 80   Temp 99 F (37.2 C) (Oral)   Resp 20   SpO2 97%    Physical Exam Constitutional:      Appearance: Normal appearance. She is normal weight.  HENT:     Head:     Comments: Eyes normal.  Ears normal.  Nose mild swelling of the nasal turbinates.  Pharynx normal with prominent posterior pharyngeal tissue Neck:     Musculoskeletal: Neck supple.     Comments: No thyromegaly Cardiovascular:     Rate and Rhythm: Normal rate and regular rhythm.     Comments: S1-S2 normal no murmurs Pulmonary:     Comments: Clear to percussion and auscultation Lymphadenopathy:     Cervical: No cervical adenopathy.  Neurological:     General: No focal deficit present.     Mental Status: She is alert and oriented to person, place, and time.  Psychiatric:        Mood and Affect: Mood normal.        Behavior: Behavior normal.        Thought Content: Thought content normal.        Judgment: Judgment normal.     Diagnostics: Allergy skin tests were positive to some molds on intradermal testing only.  Skin testing to foods was negative  Assessment and Plan: 1. Other allergic rhinitis   2. Insulin dependent diabetes mellitus ([place])   3. Celiac  disease   4. Psoriatic arthritis ([place])   5. Gastroesophageal reflux disease without esophagitis     Meds ordered this encounter  Medications  . azelastine (ASTELIN) 0.1 % nasal spray    Sig: 2 sprays per nostril twice a day if needed for a sinus headache.    Dispense:  30 mL    Refill:  5  . ipratropium (ATROVENT) 0.03 % nasal spray    Sig: 2 sprays per nostril 20 to 30 minutes before eating to see if it helps with the drainage.    Dispense:  30 mL    Refill:  5    Patient Instructions  Environmental control of dust and mold Xyzal 5 mg-take 1 tablet in the morning for a runny nose or drainage Nasal saline rinses followed by Fluticasone 2 sprays per nostril once a day  Azelastine 2 sprays per nostril twice a day if needed for a sinus headache Atrovent 0.03% - 2 sprays per nostril 20 to 30 minutes before eating to see if it helps with the drainage.  You may use 2 sprays 3 times a day for runny nose or drainage If the drainage is worse at night, you may take Benadryl 25 mg at night Continue on your other medications  Call us if  you are not doing well on this treatment plan on this treatment plan   Return in about 4 weeks (around 09/02/2018).    Thank you for the opportunity to care for this patient.  Please do not hesitate to contact me with questions.  Penne Lash, M.D.  [facility] [address] [place],  [address] [phone]

## 2018-08-06 ENCOUNTER — Ambulatory Visit: Payer: 59 | Admitting: Pediatrics

## 2018-08-06 MED ORDER — AZELASTINE HCL 0.1 % NA SOLN: NASAL | 5 refills | Status: DC

## 2018-08-06 MED ORDER — IPRATROPIUM BROMIDE 0.03 % NA SOLN: NASAL | 5 refills | Status: DC

## 2018-08-11 ENCOUNTER — Other Ambulatory Visit: Payer: Self-pay

## 2018-08-11 ENCOUNTER — Encounter: Payer: 59 | Admitting: Family Medicine

## 2018-08-12 ENCOUNTER — Ambulatory Visit (INDEPENDENT_AMBULATORY_CARE_PROVIDER_SITE_OTHER): Payer: 59 | Admitting: Family Medicine

## 2018-08-12 ENCOUNTER — Other Ambulatory Visit: Payer: Self-pay

## 2018-08-12 ENCOUNTER — Encounter: Payer: Self-pay | Admitting: Family Medicine

## 2018-08-12 DIAGNOSIS — E039 Hypothyroidism, unspecified: Secondary | ICD-10-CM | POA: Diagnosis not present

## 2018-08-12 NOTE — Progress Notes (Signed)
Chief Complaint  Patient presents with  . Follow-up    thyroid    Subjective: Patient is a 38 y.o. female here for hypothyroid f/u. Due to COVID-19 pandemic, we are interacting via web portal for an electronic face-to-face visit. I verified patient's ID using 2 identifiers. Patient agreed to proceed with visit via this method. Patient is at home, I am at office. Patient and I are present for visit.   Hypothyroidism Patient presents for follow-up of hypothyroidism.  Reports noncompliance with medication- Synthroid 25 mcg/d. Current symptoms include: fatigue and weight gain, dry skin, some cold/heat intolerance, but this is improved Denies: constipation  ROS: Endo: As noted in HPI  Past Medical History:  Diagnosis Date  . Celiac disease   . GERD (gastroesophageal reflux disease)   . Hypothyroidism   . Iron deficiency   . Psoriatic arthritis ([place])   . Type 1 diabetes mellitus (HCC)     Objective: No conversational dyspnea Age appropriate judgment and insight Nml affect and mood  Assessment and Plan: Hypothyroidism, unspecified type  Uncontrolled. Take medicine daily. Ck labs at end of month. F/u pending lab results.  The patient voiced understanding and agreement to the plan.  [place], DO 08/12/18  3:52 PM

## 2018-12-29 ENCOUNTER — Encounter: Payer: 59 | Admitting: Family Medicine

## 2019-01-01 ENCOUNTER — Other Ambulatory Visit: Payer: Self-pay

## 2019-01-01 ENCOUNTER — Encounter: Payer: Self-pay | Admitting: Family Medicine

## 2019-01-01 ENCOUNTER — Ambulatory Visit (INDEPENDENT_AMBULATORY_CARE_PROVIDER_SITE_OTHER): Payer: BC Managed Care – PPO | Admitting: Family Medicine

## 2019-01-01 VITALS — BP 120/68 | HR 67 | Temp 97.5°F | Ht 63.0 in | Wt 125.0 lb

## 2019-01-01 DIAGNOSIS — Z0000 Encounter for general adult medical examination without abnormal findings: Secondary | ICD-10-CM

## 2019-01-01 DIAGNOSIS — K900 Celiac disease: Secondary | ICD-10-CM

## 2019-01-01 LAB — COMPREHENSIVE METABOLIC PANEL
ALT: 12 U/L (ref 0–35)
AST: 16 U/L (ref 0–37)
Albumin: 4.2 g/dL (ref 3.5–5.2)
Alkaline Phosphatase: 32 U/L — ABNORMAL LOW (ref 39–117)
BUN: 13 mg/dL (ref 6–23)
CO2: 26 mEq/L (ref 19–32)
Calcium: 9.6 mg/dL (ref 8.4–10.5)
Chloride: 102 mEq/L (ref 96–112)
Creatinine, Ser: 0.79 mg/dL (ref 0.40–1.20)
GFR: 80.35 mL/min (ref >60.00)
Glucose, Bld: 122 mg/dL — ABNORMAL HIGH (ref 70–99)
Potassium: 4.3 mEq/L (ref 3.5–5.1)
Sodium: 138 mEq/L (ref 135–145)
Total Bilirubin: 0.7 mg/dL (ref 0.2–1.2)
Total Protein: 7.3 g/dL (ref 6.0–8.3)

## 2019-01-01 LAB — LIPID PANEL
Cholesterol: 129 mg/dL (ref 0–200)
HDL: 60.5 mg/dL (ref >39.00)
LDL Cholesterol: 52 mg/dL (ref 0–99)
NonHDL: 68.12
Total CHOL/HDL Ratio: 2
Triglycerides: 80 mg/dL (ref 0.0–149.0)
VLDL: 16 mg/dL (ref 0.0–40.0)

## 2019-01-01 LAB — IBC + FERRITIN
Ferritin: 90.7 ng/mL (ref 10.0–291.0)
Iron: 144 ug/dL (ref 42–145)
Saturation Ratios: 52.7 % — ABNORMAL HIGH (ref 20.0–50.0)
Transferrin: 195 mg/dL — ABNORMAL LOW (ref 212.0–360.0)

## 2019-01-01 LAB — CBC
HCT: 39.4 % (ref 36.0–46.0)
Hemoglobin: 13.4 g/dL (ref 12.0–15.0)
MCHC: 34 g/dL (ref 30.0–36.0)
MCV: 90.5 fl (ref 78.0–100.0)
Platelets: 256 10*3/uL (ref 150.0–400.0)
RBC: 4.35 Mil/uL (ref 3.87–5.11)
RDW: 13.5 % (ref 11.5–15.5)
WBC: 7.4 10*3/uL (ref 4.0–10.5)

## 2019-01-01 LAB — TSH: TSH: 2.26 u[IU]/mL (ref 0.35–4.50)

## 2019-01-01 MED ORDER — GABAPENTIN 100 MG PO CAPS: 100.0000 mg | ORAL_CAPSULE | Freq: Every day | ORAL | 0 refills | Status: DC

## 2019-01-01 NOTE — Progress Notes (Signed)
Chief Complaint  Patient presents with  . Annual Exam     Well Woman [name] is here for a complete physical.   Her last physical was >1 year ago.  Current diet: in general, a "healthy" diet. Current exercise: walking Weight is stable and she denies daytime fatigue. No LMP recorded. Seatbelt? Yes  Health Maintenance Pap/HPV- Yes Mammogram- No Tetanus- Yes HIV screening- Yes  Past Medical History:  Diagnosis Date  . Celiac disease   . GERD (gastroesophageal reflux disease)   . Hypothyroidism   . Iron deficiency   . Psoriatic arthritis ([place])   . Type 1 diabetes mellitus ([place])      History reviewed. No pertinent surgical history.  Medications  Current Outpatient Medications on File Prior to Visit  Medication Sig Dispense Refill  . clobetasol (TEMOVATE) 0.05 % external solution Apply 1 application topically 2 (two) times daily.    Marland Kitchen HUMALOG 100 UNIT/ML injection     . levocetirizine (XYZAL) 5 MG tablet Take 1 tablet (5 mg total) by mouth every evening. 30 tablet 2  . levothyroxine (SYNTHROID, LEVOTHROID) 25 MCG tablet Take 1 tablet (25 mcg total) by mouth daily before breakfast. 30 tablet 2  . meloxicam (MOBIC) 15 MG tablet Take 15 mg by mouth daily. For joint pain     Allergies No Known Allergies  Review of Systems: Constitutional:  no unexpected weight changes Eye:  no recent significant change in vision Ear/Nose/Mouth/Throat:  Ears:  no tinnitus or vertigo and no recent change in hearing Nose/Mouth/Throat:  no complaints of nasal congestion, no sore throat Cardiovascular: no chest pain Respiratory:  no cough and no shortness of breath Gastrointestinal:  no abdominal pain, no change in bowel habits GU:  Female: negative for dysuria or pelvic pain Musculoskeletal/Extremities:  no pain of the joints Integumentary (Skin/Breast): breakout on RLE (managed by derm); otherwise no abnormal skin lesions reported Neurologic:  no headaches Endocrine:  denies  fatigue Hematologic/Lymphatic:  No areas of easy bleeding  Exam BP 120/68 (BP Location: Left Arm, Patient Position: Sitting, Cuff Size: Normal)   Pulse 67   Temp (!) 97.5 F (36.4 C) (Temporal)   Ht 5' 3"  (1.6 m)   Wt 125 lb (56.7 kg)   SpO2 97%   BMI 22.14 kg/m  General:  well developed, well nourished, in no apparent distress Skin:  no significant moles, warts, or growths Head:  no masses, lesions, or tenderness Eyes:  pupils equal and round, sclera anicteric without injection Ears:  canals without lesions, TMs shiny without retraction, no obvious effusion, no erythema Nose:  nares patent, septum midline, mucosa normal, and no drainage or sinus tenderness Throat/Pharynx:  lips and gingiva without lesion; tongue and uvula midline; non-inflamed pharynx; no exudates or postnasal drainage Neck: neck supple without adenopathy, thyromegaly, or masses Lungs:  clear to auscultation, breath sounds equal bilaterally, no respiratory distress Cardio:  regular rate and rhythm, no bruits, no LE edema Abdomen:  abdomen soft, nontender; bowel sounds normal; no masses or organomegaly Genital: Defer to GYN Musculoskeletal:  symmetrical muscle groups noted without atrophy or deformity Extremities:  no clubbing, cyanosis, or edema, no deformities, no skin discoloration Neuro:  gait normal; deep tendon reflexes normal and symmetric Psych: well oriented with normal range of affect and appropriate judgment/insight  Assessment and Plan  Well adult exam - Plan: CBC, Comprehensive metabolic panel, Lipid panel, TSH, IBC + Ferritin  Celiac disease - Plan: Ambulatory referral to Gastroenterology   Well 38 y.o. female. Counseled  on diet and exercise. # for imaging center for mammogram.  Hx of Celiac, requesting [facility], referral placed.  Other orders as above. Follow up in 6 mo or prn. The patient voiced understanding and agreement to the plan.  [place], DO 01/01/19 3:15 PM

## 2019-01-01 NOTE — Patient Instructions (Addendum)
Give [place] 2-3 business days to get the results of your labs back.   Keep the diet clean and stay active.  Consider setting an alarm on your phone to remember to take your medicine.  Call: [phone] for the mammogram.   Let us know if you need anything.

## 2019-01-13 ENCOUNTER — Telehealth: Payer: Self-pay | Admitting: Family Medicine

## 2019-01-13 ENCOUNTER — Other Ambulatory Visit: Payer: Self-pay | Admitting: Family Medicine

## 2019-01-13 DIAGNOSIS — E109 Type 1 diabetes mellitus without complications: Secondary | ICD-10-CM

## 2019-01-13 NOTE — Telephone Encounter (Signed)
Pt has a sore in the crease of her left pinky toe. Pt is requesting to have a referral to a podiatrist because she is diabetic.  Please assist    CB: [phone]

## 2019-01-13 NOTE — Telephone Encounter (Signed)
Please advise 

## 2019-01-13 NOTE — Telephone Encounter (Signed)
Referral done

## 2019-01-13 NOTE — Telephone Encounter (Signed)
OK 

## 2019-01-19 ENCOUNTER — Other Ambulatory Visit: Payer: Self-pay

## 2019-01-19 ENCOUNTER — Ambulatory Visit: Payer: BC Managed Care – PPO | Admitting: Podiatry

## 2019-01-19 ENCOUNTER — Other Ambulatory Visit: Payer: Self-pay | Admitting: Podiatry

## 2019-01-19 DIAGNOSIS — M2042 Other hammer toe(s) (acquired), left foot: Secondary | ICD-10-CM

## 2019-01-19 DIAGNOSIS — D229 Melanocytic nevi, unspecified: Secondary | ICD-10-CM | POA: Diagnosis not present

## 2019-01-19 DIAGNOSIS — M79672 Pain in left foot: Secondary | ICD-10-CM

## 2019-02-16 DIAGNOSIS — L290 Pruritus ani: Secondary | ICD-10-CM | POA: Insufficient documentation

## 2019-02-16 DIAGNOSIS — N926 Irregular menstruation, unspecified: Secondary | ICD-10-CM | POA: Insufficient documentation

## 2019-02-18 ENCOUNTER — Encounter: Payer: Self-pay | Admitting: Family Medicine

## 2019-02-26 ENCOUNTER — Other Ambulatory Visit: Payer: Self-pay

## 2019-02-26 ENCOUNTER — Ambulatory Visit (INDEPENDENT_AMBULATORY_CARE_PROVIDER_SITE_OTHER): Payer: BC Managed Care – PPO | Admitting: Podiatry

## 2019-02-26 DIAGNOSIS — D229 Melanocytic nevi, unspecified: Secondary | ICD-10-CM

## 2019-02-26 DIAGNOSIS — L819 Disorder of pigmentation, unspecified: Secondary | ICD-10-CM

## 2019-02-26 NOTE — Progress Notes (Signed)
Subjective:  Patient ID: Sarah Flynn, female    DOB: 1980-05-08,  MRN: [id number]  Chief Complaint  Patient presents with  . Foot Problem    Lt 5th toe lesion (at back of toe) x 3 wks; 1/10 tenderness -pt denie sswelling/redness/drainage -pt states it was very painful Tx: keflex (Rx by PCP) and epsom salt soaking  . Diabetes    FBS: 87 A1C: 6 PCP: Hart Carwin    38 y.o. female presents with the above complaint.   Review of Systems: Negative except as noted in the HPI. Denies N/V/F/Ch.  Past Medical History:  Diagnosis Date  . Celiac disease   . GERD (gastroesophageal reflux disease)   . Hypothyroidism   . Iron deficiency   . Psoriatic arthritis ([place])   . Type 1 diabetes mellitus (HCC)     Current Outpatient Medications:  .  benzonatate (TESSALON) 200 MG capsule, Take 1 capsule (200 mg total) by mouth 2 (two) times daily as needed for cough., Disp: 20 capsule, Rfl: 0 .  clindamycin (CLEOCIN T) 1 % external solution, , Disp: , Rfl:  .  clobetasol (TEMOVATE) 0.05 % external solution, Apply 1 application topically 2 (two) times daily., Disp: , Rfl:  .  dicyclomine (BENTYL) 10 MG capsule, Take 1 tab every 6 hours as needed for abdominal cramping., Disp: 30 capsule, Rfl: 0 .  fluticasone (FLONASE) 50 MCG/ACT nasal spray, Place 2 sprays into both nostrils daily. (Patient not taking: Reported on 08/06/2019), Disp: 16 g, Rfl: 1 .  gabapentin (NEURONTIN) 100 MG capsule, Take 1 capsule (100 mg total) by mouth at bedtime., Disp: 30 capsule, Rfl: 0 .  HUMALOG 100 UNIT/ML injection, , Disp: , Rfl:  .  hydrocortisone 2.5 % ointment, Apply thin layer to affected area TID prn, Disp: , Rfl:  .  Insulin Disposable Pump (OMNIPOD DASH 5 PACK PODS) MISC, USE 1 POD EVERY 3 DAYS AS DIRECTED, Disp: , Rfl:  .  levocetirizine (XYZAL) 5 MG tablet, Take 1 tablet (5 mg total) by mouth every evening., Disp: 30 tablet, Rfl: 2 .  levothyroxine (SYNTHROID, LEVOTHROID) 25 MCG tablet, Take 1 tablet (25 mcg total) by  mouth daily before breakfast., Disp: 30 tablet, Rfl: 2 .  meloxicam (MOBIC) 15 MG tablet, Take 15 mg by mouth daily. For joint pain, Disp: , Rfl:  .  ondansetron (ZOFRAN-ODT) 4 MG disintegrating tablet, Take 1 tablet (4 mg total) by mouth every 8 (eight) hours as needed for nausea or vomiting., Disp: 20 tablet, Rfl: 0 .  STELARA 45 MG/0.5ML SOSY injection, , Disp: , Rfl:  .  valACYclovir (VALTREX) 1000 MG tablet, TK 2 TS PO AT ONSET THEN 2 TS PO 12 HOURS LATE PRF OUTBREAKS, Disp: , Rfl:   Social History   Tobacco Use  Smoking Status Never Smoker  Smokeless Tobacco Never Used    No Known Allergies Objective:  There were no vitals filed for this visit. There is no height or weight on file to calculate BMI. Constitutional Well developed. Well nourished.  Vascular Dorsalis pedis pulses palpable bilaterally. Posterior tibial pulses palpable bilaterally. Capillary refill normal to all digits.  No cyanosis or clubbing noted. Pedal hair growth normal.  Neurologic Normal speech. Oriented to person, place, and time. Epicritic sensation to light touch grossly present bilaterally.  Dermatologic Nails normal  Skin blackened skin lesion left fifth toe area  Orthopedic: Normal joint ROM without pain or crepitus bilaterally. No visible deformities. No bony tenderness.   Radiographs: None Assessment:   1.  Hammer toe of left foot   2. Suspicious nevus    Plan:  Patient was evaluated and treated and all questions answered.  Hammertoe left 5th toe -Educated on etiology  Skin lesion left foot -Discussed possible biopsy -Patient wishes to think it over.  We will schedule biopsy 4 weeks.  Return in about 4 weeks (around 02/16/2019).

## 2019-03-12 ENCOUNTER — Ambulatory Visit (INDEPENDENT_AMBULATORY_CARE_PROVIDER_SITE_OTHER): Payer: BC Managed Care – PPO | Admitting: Podiatry

## 2019-03-12 ENCOUNTER — Other Ambulatory Visit: Payer: Self-pay

## 2019-03-12 DIAGNOSIS — L819 Disorder of pigmentation, unspecified: Secondary | ICD-10-CM | POA: Diagnosis not present

## 2019-03-12 DIAGNOSIS — D229 Melanocytic nevi, unspecified: Secondary | ICD-10-CM

## 2019-03-12 NOTE — Progress Notes (Signed)
  Subjective:  Patient ID: Sarah Flynn, female    DOB: [birth year],  MRN: [id number]  Chief Complaint  Patient presents with  . Follow-up    2 wk F/U appt for Ulcer   38 y.o. female presents with the above complaint. States the area hurt a little bit and just within the past few days stopped hurting. Denies other issues.  Review of Systems: Negative except as noted in the HPI. Denies N/V/F/Ch.  Past Medical History:  Diagnosis Date  . Celiac disease   . GERD (gastroesophageal reflux disease)   . Hypothyroidism   . Iron deficiency   . Psoriatic arthritis ([place])   . Type 1 diabetes mellitus (HCC)     Current Outpatient Medications:  .  cephALEXin (KEFLEX) 250 MG capsule, , Disp: , Rfl:  .  clindamycin (CLEOCIN T) 1 % external solution, , Disp: , Rfl:  .  clobetasol (TEMOVATE) 0.05 % external solution, Apply 1 application topically 2 (two) times daily., Disp: , Rfl:  .  doxycycline (VIBRAMYCIN) 50 MG capsule, , Disp: , Rfl:  .  gabapentin (NEURONTIN) 100 MG capsule, Take 1 capsule (100 mg total) by mouth at bedtime., Disp: 30 capsule, Rfl: 0 .  HUMALOG 100 UNIT/ML injection, , Disp: , Rfl:  .  hydrocortisone 2.5 % ointment, Apply thin layer to affected area TID prn, Disp: , Rfl:  .  levocetirizine (XYZAL) 5 MG tablet, Take 1 tablet (5 mg total) by mouth every evening., Disp: 30 tablet, Rfl: 2 .  levothyroxine (SYNTHROID, LEVOTHROID) 25 MCG tablet, Take 1 tablet (25 mcg total) by mouth daily before breakfast., Disp: 30 tablet, Rfl: 2 .  meloxicam (MOBIC) 15 MG tablet, Take 15 mg by mouth daily. For joint pain, Disp: , Rfl:  .  STELARA 45 MG/0.5ML SOSY injection, , Disp: , Rfl:  .  valACYclovir (VALTREX) 1000 MG tablet, TK 2 TS PO AT ONSET THEN 2 TS PO 12 HOURS LATE PRF OUTBREAKS, Disp: , Rfl:   Social History   Tobacco Use  Smoking Status Never Smoker  Smokeless Tobacco Never Used    No Known Allergies Objective:  There were no vitals filed for this visit. There is no height  or weight on file to calculate BMI. Constitutional Well developed. Well nourished.  Vascular Dorsalis pedis pulses palpable bilaterally. Posterior tibial pulses palpable bilaterally. Capillary refill normal to all digits.  No cyanosis or clubbing noted. Pedal hair growth normal.  Neurologic Normal speech. Oriented to person, place, and time. Epicritic sensation to light touch grossly present bilaterally.  Dermatologic Nails normal Skin biopsy site healing well slight overling hyperkeratosis  Orthopedic: Normal joint ROM without pain or crepitus bilaterally. No visible deformities. No bony tenderness.   Radiographs: None Assessment:   1. Hyperpigmented skin lesion   2. Suspicious nevus    Plan:  Patient was evaluated and treated and all questions answered.  Skin biopsy -Healing well -Called for results, unable to obtain during patient visit. Will call back with results.  No follow-ups on file.

## 2019-03-16 NOTE — Progress Notes (Signed)
Subjective:  Patient ID: Sarah Flynn, female    DOB: [birth year],  MRN: [id number]  Chief Complaint  Patient presents with  . Hammer Toe    Left 5th hammertoe, pt states "feeling alright, some irritation under the toe".  . Foot Problem    Suspicious lesion left 5th digit, pt wants to discuss biopsy.   38 y.o. female presents with the above complaint.  History above confirmed with patient  Review of Systems: Negative except as noted in the HPI. Denies N/V/F/Ch.  Past Medical History:  Diagnosis Date  . Celiac disease   . GERD (gastroesophageal reflux disease)   . Hypothyroidism   . Iron deficiency   . Psoriatic arthritis ([place])   . Type 1 diabetes mellitus (HCC)     Current Outpatient Medications:  .  cephALEXin (KEFLEX) 250 MG capsule, , Disp: , Rfl:  .  clindamycin (CLEOCIN T) 1 % external solution, , Disp: , Rfl:  .  clobetasol (TEMOVATE) 0.05 % external solution, Apply 1 application topically 2 (two) times daily., Disp: , Rfl:  .  doxycycline (VIBRAMYCIN) 50 MG capsule, , Disp: , Rfl:  .  gabapentin (NEURONTIN) 100 MG capsule, Take 1 capsule (100 mg total) by mouth at bedtime., Disp: 30 capsule, Rfl: 0 .  HUMALOG 100 UNIT/ML injection, , Disp: , Rfl:  .  hydrocortisone 2.5 % ointment, Apply thin layer to affected area TID prn, Disp: , Rfl:  .  levocetirizine (XYZAL) 5 MG tablet, Take 1 tablet (5 mg total) by mouth every evening., Disp: 30 tablet, Rfl: 2 .  levothyroxine (SYNTHROID, LEVOTHROID) 25 MCG tablet, Take 1 tablet (25 mcg total) by mouth daily before breakfast., Disp: 30 tablet, Rfl: 2 .  meloxicam (MOBIC) 15 MG tablet, Take 15 mg by mouth daily. For joint pain, Disp: , Rfl:  .  STELARA 45 MG/0.5ML SOSY injection, , Disp: , Rfl:  .  valACYclovir (VALTREX) 1000 MG tablet, TK 2 TS PO AT ONSET THEN 2 TS PO 12 HOURS LATE PRF OUTBREAKS, Disp: , Rfl:   Social History   Tobacco Use  Smoking Status Never Smoker  Smokeless Tobacco Never Used    No Known Allergies  Objective:  There were no vitals filed for this visit. There is no height or weight on file to calculate BMI. Constitutional Well developed. Well nourished.  Vascular Dorsalis pedis pulses palpable bilaterally. Posterior tibial pulses palpable bilaterally. Capillary refill normal to all digits.  No cyanosis or clubbing noted. Pedal hair growth normal.  Neurologic Normal speech. Oriented to person, place, and time. Epicritic sensation to light touch grossly present bilaterally.  Dermatologic Nails normal Skin suspicious nevus left distal aspect of the fifth toe variegated brown-black irregular borders  Orthopedic: Normal joint ROM without pain or crepitus bilaterally. No visible deformities. No bony tenderness.   Radiographs: None Assessment:   1. Hyperpigmented skin lesion   2. Suspicious nevus    Plan:  Patient was evaluated and treated and all questions answered.  Skin lesion left foot -Shave biopsy performed for suspicious lesion  Procedure: Shave biopsy Indication: Suspicious nevus Anesthesia: Lidocaine 1% with epi 3 mL Instrumentation: 15 blade Technique: Following anesthetization and local skin prep the lesion was shaved and collected for pathology Dressing: Dry, sterile, compression dressing. Specimen: Labeled and sent for pathology. Disposition: Patient tolerated procedure well. Leave dressing on for 48 hours. Thereafter dress with antibiotic ointment and band-aid. Off-loading pads dispensed. Patient to return in 1 week for follow-up.       No  follow-ups on file.

## 2019-03-17 ENCOUNTER — Telehealth: Payer: Self-pay | Admitting: *Deleted

## 2019-03-17 NOTE — Telephone Encounter (Signed)
Bako asked if our office had received 02/26/2019 pathology. I told [employer] we had received the results.

## 2019-03-22 ENCOUNTER — Telehealth: Payer: Self-pay | Admitting: *Deleted

## 2019-03-22 NOTE — Telephone Encounter (Signed)
Pt is calling for biopsy results.

## 2019-03-30 NOTE — Telephone Encounter (Signed)
unable to leave a message on pt's currently listed phone number, mailbox is full. Mailed a letter to pt with Dr. Eleanora Neighbor review of results and recommendations.

## 2019-03-30 NOTE — Telephone Encounter (Signed)
I tried calling and her mailbox was full. Can you call her and let her know the results appear benign but if it changes she would benefit from further biopsy

## 2019-04-27 ENCOUNTER — Encounter: Payer: Self-pay | Admitting: Family Medicine

## 2019-04-30 ENCOUNTER — Other Ambulatory Visit: Payer: Self-pay

## 2019-04-30 ENCOUNTER — Ambulatory Visit (INDEPENDENT_AMBULATORY_CARE_PROVIDER_SITE_OTHER): Payer: BC Managed Care – PPO | Admitting: Medical

## 2019-04-30 ENCOUNTER — Ambulatory Visit: Payer: BC Managed Care – PPO | Attending: Internal Medicine

## 2019-04-30 VITALS — Temp 99.0°F | Ht 63.0 in | Wt 122.0 lb

## 2019-04-30 DIAGNOSIS — J3489 Other specified disorders of nose and nasal sinuses: Secondary | ICD-10-CM

## 2019-04-30 DIAGNOSIS — R0981 Nasal congestion: Secondary | ICD-10-CM | POA: Diagnosis not present

## 2019-04-30 DIAGNOSIS — Z20822 Contact with and (suspected) exposure to covid-19: Secondary | ICD-10-CM

## 2019-04-30 DIAGNOSIS — R5383 Other fatigue: Secondary | ICD-10-CM

## 2019-04-30 MED ORDER — FLUTICASONE PROPIONATE 50 MCG/ACT NA SUSP: 2.0000 | Freq: Every day | NASAL | 1 refills | Status: DC

## 2019-04-30 MED ORDER — BENZONATATE 100 MG PO CAPS: 100.0000 mg | ORAL_CAPSULE | Freq: Three times a day (TID) | ORAL | 0 refills | Status: DC | PRN

## 2019-04-30 MED ORDER — AZITHROMYCIN 250 MG PO TABS: ORAL_TABLET | ORAL | 0 refills | Status: DC

## 2019-04-30 NOTE — Patient Instructions (Addendum)
Rest hydrate and use  flonase.  Get tested at [facility] gave number.  Counseled if signs/symptoms change or worsen whether sinus infection or bronchitis type symptoms then can use zpack and benzonate/ making available over weekend.  Doubt flu but if so then passed tx time frame.  Follow up early next week by my chart after repest covid test results(possible false negative early covid test at work?)  Presently over the last 24 hours signs/symptoms have improved a lot per patient description.  I do not think presently that antibiotics, cough medication or inhalers are indicated.

## 2019-04-30 NOTE — Progress Notes (Signed)
   Subjective:    Patient ID: Sarah Flynn, female    DOB: Aug 05, 1980, 39 y.o.   MRN: [id number]  HPI   Virtual Visit via Telephone Note  I connected with Sarah Flynn on 04/30/19 at  8:40 AM EST by telephone and verified that I am speaking with the correct person using two identifiers.  Location: Patient: home Provider: office   I discussed the limitations, risks, security and privacy concerns of performing an evaluation and management service by telephone and the availability of in person appointments. I also discussed with the patient that there may be a patient responsible charge related to this service. The patient expressed understanding and agreed to proceed.   History of Present Illness:  Pt in with recent symptoms for one week. States nasal congestion, st, tired eye feeling, and general mild fatigue. This Monday and Tuesday felt same but fatigue got worse. Then she got body aches. She has history of psoriatic arthritis. Pt states Wed she got tested for covid and test was negative. Pt states last 2 days has had fever. Some loose stools and some diarrhea for one day only. Today no fever.   Pt works at [employer]. Rapid test was negative.   Today states some pnd. No significant cough. State no sinus pressure.    Pt states today overall feeling better than has since onset of illness.    Observations/Objective: General- no acute distress, pleasant, alert and oriented.    Assessment and Plan: Rest hydrate and use  flonase.  Get tested at [facility] gave number.  Counseled if signs/symptoms change or worsen whether sinus infection or bronchitis type symptoms then can use zpack and benzonate/ making available over weekend.  Doubt flu but if so then passed tx time frame.  Follow up early next week by my chart after repest covid test results(possible false negative early covid test at work?)  Presently over the last 24 hours signs/symptoms have improved  a lot per patient description.  I do not think presently that antibiotics, cough medication or inhalers are indicated.  Follow Up Instructions:    I discussed the assessment and treatment plan with the patient. The patient was provided an opportunity to ask questions and all were answered. The patient agreed with the plan and demonstrated an understanding of the instructions.   The patient was advised to call back or seek an in-person evaluation if the symptoms worsen or if the condition fails to improve as anticipated.     Mackie Pai, PA-C    Review of Systems     Objective:   Physical Exam        Assessment & Plan:

## 2019-05-01 LAB — NOVEL CORONAVIRUS, NAA: SARS-CoV-2, NAA: NOT DETECTED

## 2019-06-28 ENCOUNTER — Other Ambulatory Visit: Payer: Self-pay | Admitting: Family Medicine

## 2019-06-28 ENCOUNTER — Encounter: Payer: Self-pay | Admitting: Family Medicine

## 2019-06-28 DIAGNOSIS — R1084 Generalized abdominal pain: Secondary | ICD-10-CM

## 2019-06-28 DIAGNOSIS — R197 Diarrhea, unspecified: Secondary | ICD-10-CM

## 2019-06-28 NOTE — Progress Notes (Signed)
referral

## 2019-07-02 ENCOUNTER — Ambulatory Visit: Payer: BC Managed Care – PPO | Admitting: Family Medicine

## 2019-07-27 ENCOUNTER — Encounter: Payer: Self-pay | Admitting: Family Medicine

## 2019-07-27 ENCOUNTER — Ambulatory Visit (INDEPENDENT_AMBULATORY_CARE_PROVIDER_SITE_OTHER): Payer: BC Managed Care – PPO | Admitting: Family Medicine

## 2019-07-27 DIAGNOSIS — R509 Fever, unspecified: Secondary | ICD-10-CM

## 2019-07-27 MED ORDER — DICYCLOMINE HCL 10 MG PO CAPS: ORAL_CAPSULE | ORAL | 0 refills | Status: DC

## 2019-07-27 MED ORDER — DOXYCYCLINE HYCLATE 100 MG PO TABS: 100.0000 mg | ORAL_TABLET | Freq: Two times a day (BID) | ORAL | 0 refills | Status: DC

## 2019-07-27 MED ORDER — ONDANSETRON 4 MG PO TBDP: 4.0000 mg | ORAL_TABLET | Freq: Three times a day (TID) | ORAL | 0 refills | Status: DC | PRN

## 2019-07-27 NOTE — Progress Notes (Signed)
CC: [name]  Sarah Flynn here for [name] complaints. Due to COVID-19 pandemic, we are interacting via web portal for an electronic face-to-face visit. I verified patient's ID using 2 identifiers. Patient agreed to proceed with visit via this method. Patient is at home, I am at office. Patient and I are present for visit.    Duration: 2 days  Associated symptoms: Fever (102.8 F max), rhinorrhea, nasal congestion, scratchy throat, cough, myalgias, diarrhea, N/V, abd pain/cramping Denies: sinus pain, itchy watery eyes, ear drainage, sore throat, wheezing and shortness of breath Treatment to date: Mucinex, Tylenol Sick contacts: No  Past Medical History:  Diagnosis Date  . Celiac disease   . GERD (gastroesophageal reflux disease)   . Hypothyroidism   . Iron deficiency   . Psoriatic arthritis ([place])   . Type 1 diabetes mellitus (HCC)    Exam No conversational dyspnea Age appropriate judgment and insight Nml affect and mood  Febrile illness - Plan: dicyclomine (BENTYL) 10 MG capsule, ondansetron (ZOFRAN-ODT) 4 MG disintegrating tablet, doxycycline (VIBRA-TABS) 100 MG tablet  Orders as above. Continue to push fluids, practice good hand hygiene, cover mouth when coughing. Covid testing rec'd.  F/u prn. If starting to experience fevers, shaking, or shortness of breath, seek immediate care. Pt voiced understanding and agreement to the plan.  [place], DO 07/27/19 9:00 AM

## 2019-07-30 ENCOUNTER — Ambulatory Visit: Payer: BC Managed Care – PPO | Admitting: Family Medicine

## 2019-08-02 ENCOUNTER — Telehealth: Payer: Self-pay

## 2019-08-02 NOTE — Telephone Encounter (Signed)
Called left message to call back 

## 2019-08-02 NOTE — Telephone Encounter (Signed)
Please see how she is doing.  Thank you.

## 2019-08-02 NOTE — Telephone Encounter (Signed)
Nurse Assessment Nurse: Hassell Done, RN, Melanie Date/Time (Eastern Time): 08/01/2019 6:30:23 PM Confirm and document reason for call. If symptomatic, describe symptoms. ---Caller states she was diagnosed with Covid on Thursday and she has had symptoms since Monday. Has had coughing and stuff so doesnt really think vomiting is from Covid. Having cramping and upper back pain. Has the patient had close contact with a person known or suspected to have the novel coronavirus illness OR traveled / lives in area with major community spread (including international travel) in the last 14 days from the onset of symptoms? * If Asymptomatic, screen for exposure and travel within the last 14 days. ---Yes Does the patient have any new or worsening symptoms? ---Yes Will a triage be completed? ---Yes Related visit to physician within the last 2 weeks? ---No Does the PT have any chronic conditions? (i.e. diabetes, asthma, this includes High risk factors for pregnancy, etc.) ---Yes List chronic conditions. ---diabetes Is the patient pregnant or possibly pregnant? (Ask all females between the ages of 23-55) ---No Is this a behavioral health or substance abuse call? ---NoCall Id: [id number] Guidelines Guideline Title Affirmed Question Affirmed Notes Nurse Date/Time Eilene Ghazi Time) COVID-19 - Diagnosed or Suspected [1] Continuous (nonstop) coughing interferes with work or school AND [2] no improvement using cough treatment per protocol Hassell Done, RN, Threasa Beards 08/01/2019 6:32:45 PM Vomiting High-risk adult (e.g., diabetes mellitus, brain tumor, V-P shunt, hernia) Hassell Done, RN, Melanie 08/01/2019 6:39:10 PM Diabetes - Low Blood Sugar [1] Blood glucose < 70 mg/dL (3.9 mmol/L) or symptomatic AND [2] cause known Arnaldo Natal 08/01/2019 6:45:15 PM Disp. Time Eilene Ghazi Time) Disposition Final User 08/01/2019 6:38:39 PM Call PCP within 24 Hours Arnaldo Natal 08/01/2019 6:44:38 PM Go to ED Now (or PCP  triage) Hassell Done, RN, Melanie 08/01/2019 6:48:36 PM Home Care Yes Hassell Done, RN, Threasa Beards

## 2019-08-03 ENCOUNTER — Other Ambulatory Visit: Payer: Self-pay | Admitting: Family Medicine

## 2019-08-03 MED ORDER — PREDNISONE 20 MG PO TABS: 40.0000 mg | ORAL_TABLET | Freq: Every day | ORAL | 0 refills | Status: AC

## 2019-08-03 NOTE — Telephone Encounter (Signed)
The patient called back and is doing ok now. Really tough days at first but better now. She now has a really bad cough because of sinus pressure. She sent a message on my chart today requesting a z pak. No fever in 3 days.

## 2019-08-03 NOTE — Telephone Encounter (Signed)
Called left message to call back 

## 2019-08-03 NOTE — Telephone Encounter (Signed)
Patient called back and wanted to know if safe to take the prednisone along with all her other medications, specifically gabapentin?

## 2019-08-04 NOTE — Telephone Encounter (Signed)
Called left message to call back 

## 2019-08-04 NOTE — Telephone Encounter (Signed)
Yes

## 2019-08-04 NOTE — Telephone Encounter (Signed)
Called the patient back left detailed message of PCP response

## 2019-08-04 NOTE — Telephone Encounter (Signed)
As needed

## 2019-08-04 NOTE — Telephone Encounter (Signed)
Called informed of response She also had another question---is it necessary to take OTC cold medications along with the prednisone

## 2019-08-05 NOTE — Telephone Encounter (Signed)
Please advise 

## 2019-08-06 ENCOUNTER — Ambulatory Visit (INDEPENDENT_AMBULATORY_CARE_PROVIDER_SITE_OTHER): Payer: BC Managed Care – PPO | Admitting: Nurse Practitioner

## 2019-08-06 ENCOUNTER — Ambulatory Visit
Admission: RE | Admit: 2019-08-06 | Discharge: 2019-08-06 | Disposition: A | Discharge status: Home / Self Care | Payer: BC Managed Care – PPO | Source: Ambulatory Visit | Attending: Nurse Practitioner | Admitting: Nurse Practitioner

## 2019-08-06 ENCOUNTER — Other Ambulatory Visit: Payer: Self-pay

## 2019-08-06 VITALS — BP 102/74 | HR 85 | Temp 97.5°F | Ht 63.0 in | Wt 125.0 lb

## 2019-08-06 DIAGNOSIS — R059 Cough, unspecified: Secondary | ICD-10-CM

## 2019-08-06 DIAGNOSIS — M546 Pain in thoracic spine: Secondary | ICD-10-CM | POA: Insufficient documentation

## 2019-08-06 DIAGNOSIS — Z8616 Personal history of COVID-19: Secondary | ICD-10-CM

## 2019-08-06 DIAGNOSIS — R05 Cough: Secondary | ICD-10-CM

## 2019-08-06 DIAGNOSIS — R5383 Other fatigue: Secondary | ICD-10-CM | POA: Diagnosis not present

## 2019-08-06 DIAGNOSIS — G8929 Other chronic pain: Secondary | ICD-10-CM

## 2019-08-06 MED ORDER — BENZONATATE 200 MG PO CAPS: 200.0000 mg | ORAL_CAPSULE | Freq: Two times a day (BID) | ORAL | 0 refills | Status: DC | PRN

## 2019-08-06 MED ORDER — PROMETHAZINE-DM 6.25-15 MG/5ML PO SYRP: 5.0000 mL | ORAL_SOLUTION | Freq: Four times a day (QID) | ORAL | 0 refills | Status: AC | PRN

## 2019-08-06 NOTE — Assessment & Plan Note (Signed)
Cough Chest congestion Fatigue:  May continue Tessalon perles as needed  Will order cough syrup for nighttime use if needed  May start mucinex DM twice daily  May start Zyrtec daily  Continue Flonase daily  Deep breathing exercises  Will order chest x ray  Will order labs  Back Pain:  May alternate heat and cold packs  May take tylenol or motrin for pain   Follow up in 2 weeks or sooner if needed

## 2019-08-06 NOTE — Progress Notes (Signed)
@Patient  ID: Sarah Flynn, female    DOB: [birth year], 39 y.o.   MRN: [id number]  Chief Complaint  Patient presents with  . Post COVID    Patient tested positive on 4/20. She is still having alot of cough and fatigue, patient stated she feels very congested.     Referring provider: Shelda Pal*   39 year old female with history of allergic rhinitis, GERD, diabetes, hypothyroidism.  Diagnosed with Covid on 07/27/2019.  HPI   Patient presents today for post Covid care clinic visit.  Patient tested positive for Covid on 07/27/2019.  She states that her symptoms started on 07/26/2019.  She continues to have cough and chest congestion.  She denies any recent fevers.  Patient's PCP has not seen her in office, but has prescribed a round of doxycycline and prednisone since symptoms started.  She will finish her prednisone this Sunday.  She has Tessalon Perles for cough, but has not been taking these.  Patient states that she has not had any imaging done since diagnosed with Covid. Denies f/c/s, n/v/d, hemoptysis, PND, leg swelling, chest pain or edema.       No Known Allergies  Immunization History  Administered Date(s) Administered  . Influenza-Unspecified 01/07/2017  . Pneumococcal Polysaccharide-23 02/25/2017  . Tdap 01/06/2010    Past Medical History:  Diagnosis Date  . Celiac disease   . GERD (gastroesophageal reflux disease)   . Hypothyroidism   . Iron deficiency   . Psoriatic arthritis ([place])   . Type 1 diabetes mellitus (HCC)     Tobacco History: Social History   Tobacco Use  Smoking Status Never Smoker  Smokeless Tobacco Never Used   Counseling given: Yes   Outpatient Encounter Medications as of 08/06/2019  Medication Sig  . dicyclomine (BENTYL) 10 MG capsule Take 1 tab every 6 hours as needed for abdominal cramping.  . gabapentin (NEURONTIN) 100 MG capsule Take 1 capsule (100 mg total) by mouth at bedtime.  Marland Kitchen HUMALOG 100 UNIT/ML injection   .  hydrocortisone 2.5 % ointment Apply thin layer to affected area TID prn  . levocetirizine (XYZAL) 5 MG tablet Take 1 tablet (5 mg total) by mouth every evening.  Marland Kitchen levothyroxine (SYNTHROID, LEVOTHROID) 25 MCG tablet Take 1 tablet (25 mcg total) by mouth daily before breakfast.  . ondansetron (ZOFRAN-ODT) 4 MG disintegrating tablet Take 1 tablet (4 mg total) by mouth every 8 (eight) hours as needed for nausea or vomiting.  . [EXPIRED] predniSONE (DELTASONE) 20 MG tablet Take 2 tablets (40 mg total) by mouth daily with breakfast for 5 days.  Delsa Grana 45 MG/0.5ML SOSY injection   . valACYclovir (VALTREX) 1000 MG tablet TK 2 TS PO AT ONSET THEN 2 TS PO 12 HOURS LATE PRF OUTBREAKS  . benzonatate (TESSALON) 200 MG capsule Take 1 capsule (200 mg total) by mouth 2 (two) times daily as needed for cough.  . clindamycin (CLEOCIN T) 1 % external solution   . clobetasol (TEMOVATE) 0.05 % external solution Apply 1 application topically 2 (two) times daily.  . fluticasone (FLONASE) 50 MCG/ACT nasal spray Place 2 sprays into both nostrils daily. (Patient not taking: Reported on 08/06/2019)  . Insulin Disposable Pump (OMNIPOD DASH 5 PACK PODS) MISC USE 1 POD EVERY 3 DAYS AS DIRECTED  . meloxicam (MOBIC) 15 MG tablet Take 15 mg by mouth daily. For joint pain  . promethazine-dextromethorphan (PROMETHAZINE-DM) 6.25-15 MG/5ML syrup Take 5 mLs by mouth 4 (four) times daily as needed for up to 6  days for cough.  . [DISCONTINUED] benzonatate (TESSALON) 100 MG capsule Take 1 capsule (100 mg total) by mouth 3 (three) times daily as needed for cough. (Patient not taking: Reported on 08/06/2019)   No facility-administered encounter medications on file as of 08/06/2019.     Review of Systems  Review of Systems  Constitutional: Positive for fatigue. Negative for activity change and fever.  HENT: Negative.   Respiratory: Positive for cough. Negative for shortness of breath and wheezing.   Cardiovascular: Negative.   Negative for chest pain, palpitations and leg swelling.  Gastrointestinal: Negative.   Allergic/Immunologic: Negative.   Neurological: Negative.   Psychiatric/Behavioral: Negative.        Physical Exam  BP 102/74 (BP Location: Right Arm, Patient Position: Sitting, Cuff Size: Small)   Pulse 85   Temp (!) 97.5 F (36.4 C)   Ht 5' 3"  (1.6 m)   Wt 125 lb (56.7 kg)   LMP 08/01/2019   SpO2 99%   BMI 22.14 kg/m   Wt Readings from Last 5 Encounters:  08/06/19 125 lb (56.7 kg)  04/30/19 122 lb (55.3 kg)  01/01/19 125 lb (56.7 kg)  08/03/18 124 lb 3.2 oz (56.3 kg)  06/26/18 123 lb 6 oz (56 kg)     Physical Exam Vitals and nursing note reviewed.  Constitutional:      General: She is not in acute distress.    Appearance: She is well-developed.  Cardiovascular:     Rate and Rhythm: Normal rate and regular rhythm.  Pulmonary:     Effort: Pulmonary effort is normal.     Breath sounds: Normal breath sounds.     Comments: Congested cough noted during exam.  Neurological:     Mental Status: She is alert and oriented to person, place, and time.        Assessment & Plan:   History of COVID-19 Cough Chest congestion Fatigue:  May continue Tessalon perles as needed  Will order cough syrup for nighttime use if needed  May start mucinex DM twice daily  May start Zyrtec daily  Continue Flonase daily  Deep breathing exercises  Will order chest x ray  Will order labs  Back Pain:  May alternate heat and cold packs  May take tylenol or motrin for pain   Follow up in 2 weeks or sooner if needed      Fenton Foy, NP 08/11/2019

## 2019-08-06 NOTE — Patient Instructions (Addendum)
Cough Chest congestion Fatigue:  May continue Tessalon perles as needed  Will order cough syrup for nighttime use if needed  May start mucinex DM twice daily  May start Zyrtec daily  Continue Flonase daily  Deep breathing exercises  Will order chest x ray  Will order labs  Back Pain:  May alternate heat and cold packs  May take tylenol or motrin for pain   Follow up in 2 weeks or sooner if needed

## 2019-08-07 LAB — CBC WITH DIFFERENTIAL/PLATELET
Basophils Absolute: 0 10*3/uL (ref 0.0–0.2)
Basos: 0 %
EOS (ABSOLUTE): 0 10*3/uL (ref 0.0–0.4)
Eos: 0 %
Hematocrit: 38.9 % (ref 34.0–46.6)
Hemoglobin: 13.1 g/dL (ref 11.1–15.9)
Immature Grans (Abs): 0 10*3/uL (ref 0.0–0.1)
Immature Granulocytes: 0 %
Lymphocytes Absolute: 0.9 10*3/uL (ref 0.7–3.1)
Lymphs: 18 %
MCH: 30.7 pg (ref 26.6–33.0)
MCHC: 33.7 g/dL (ref 31.5–35.7)
MCV: 91 fL (ref 79–97)
Monocytes Absolute: 0.1 10*3/uL (ref 0.1–0.9)
Monocytes: 2 %
Neutrophils Absolute: 4 10*3/uL (ref 1.4–7.0)
Neutrophils: 80 %
Platelets: 286 10*3/uL (ref 150–450)
RBC: 4.27 x10E6/uL (ref 3.77–5.28)
RDW: 12.3 % (ref 11.7–15.4)
WBC: 5.1 10*3/uL (ref 3.4–10.8)

## 2019-08-07 LAB — COMPREHENSIVE METABOLIC PANEL
ALT: 23 IU/L (ref 0–32)
AST: 17 IU/L (ref 0–40)
Albumin/Globulin Ratio: 1.5 (ref 1.2–2.2)
Albumin: 4.4 g/dL (ref 3.8–4.8)
Alkaline Phosphatase: 31 IU/L — ABNORMAL LOW (ref 39–117)
BUN/Creatinine Ratio: 13 (ref 9–23)
BUN: 10 mg/dL (ref 6–24)
Bilirubin Total: 0.3 mg/dL (ref 0.0–1.2)
CO2: 25 mmol/L (ref 20–29)
Calcium: 9.2 mg/dL (ref 8.7–10.2)
Chloride: 102 mmol/L (ref 96–106)
Creatinine, Ser: 0.8 mg/dL (ref 0.57–1.00)
GFR calc Af Amer: 106 mL/min/{1.73_m2} (ref >59)
GFR calc non Af Amer: 92 mL/min/{1.73_m2} (ref >59)
Globulin, Total: 2.9 g/dL (ref 1.5–4.5)
Glucose: 179 mg/dL — ABNORMAL HIGH (ref 65–99)
Potassium: 4.4 mmol/L (ref 3.5–5.2)
Sodium: 140 mmol/L (ref 134–144)
Total Protein: 7.3 g/dL (ref 6.0–8.5)

## 2019-08-10 NOTE — Progress Notes (Signed)
Patient notified of results, verbally understood. No additional questions.

## 2019-09-10 DIAGNOSIS — L400 Psoriasis vulgaris: Secondary | ICD-10-CM | POA: Diagnosis not present

## 2019-09-24 DIAGNOSIS — R197 Diarrhea, unspecified: Secondary | ICD-10-CM | POA: Diagnosis not present

## 2019-09-24 DIAGNOSIS — K900 Celiac disease: Secondary | ICD-10-CM | POA: Diagnosis not present

## 2019-12-16 DIAGNOSIS — Z79899 Other long term (current) drug therapy: Secondary | ICD-10-CM | POA: Diagnosis not present

## 2019-12-16 DIAGNOSIS — L400 Psoriasis vulgaris: Secondary | ICD-10-CM | POA: Diagnosis not present

## 2020-01-19 ENCOUNTER — Other Ambulatory Visit: Payer: Self-pay | Admitting: Family Medicine

## 2020-01-24 DIAGNOSIS — R197 Diarrhea, unspecified: Secondary | ICD-10-CM | POA: Diagnosis not present

## 2020-01-24 DIAGNOSIS — Z8719 Personal history of other diseases of the digestive system: Secondary | ICD-10-CM | POA: Diagnosis not present

## 2020-01-24 DIAGNOSIS — E109 Type 1 diabetes mellitus without complications: Secondary | ICD-10-CM | POA: Diagnosis not present

## 2020-02-23 DIAGNOSIS — E039 Hypothyroidism, unspecified: Secondary | ICD-10-CM | POA: Diagnosis not present

## 2020-02-23 DIAGNOSIS — E109 Type 1 diabetes mellitus without complications: Secondary | ICD-10-CM | POA: Diagnosis not present

## 2020-03-17 DIAGNOSIS — L400 Psoriasis vulgaris: Secondary | ICD-10-CM | POA: Diagnosis not present

## 2020-05-19 ENCOUNTER — Ambulatory Visit (INDEPENDENT_AMBULATORY_CARE_PROVIDER_SITE_OTHER): Payer: BC Managed Care – PPO | Admitting: Medical

## 2020-05-19 ENCOUNTER — Other Ambulatory Visit: Payer: Self-pay

## 2020-05-19 VITALS — BP 120/60 | HR 58 | Resp 18 | Wt 123.4 lb

## 2020-05-19 DIAGNOSIS — R21 Rash and other nonspecific skin eruption: Secondary | ICD-10-CM

## 2020-05-19 MED ORDER — NYSTATIN 100000 UNIT/GM EX CREA: 1.0000 "application " | TOPICAL_CREAM | Freq: Two times a day (BID) | CUTANEOUS | 0 refills | Status: DC

## 2020-05-19 MED ORDER — VALACYCLOVIR HCL 1 G PO TABS: 1000.0000 mg | ORAL_TABLET | Freq: Two times a day (BID) | ORAL | 0 refills | Status: AC

## 2020-05-19 NOTE — Patient Instructions (Addendum)
Your rt buttock area rash appears probable fungal. Does not look viral. Can use nystatin cream twice daily and you hydrocortisone twice daily as well. Try to keep area dry.   If area changes to  Indurated/hard feeling, red and tender let me know and would give antibiotic.  Seeing gynecologist next week.  Hx of diabetes. Has endocrinologist.Discussed could refer to  Dr. Kelton Pillar is you want.   Follow up 2 weeks or as needed.

## 2020-05-19 NOTE — Progress Notes (Signed)
Subjective:    Patient ID: Sarah Flynn, female    DOB: 1980/09/21, 39 y.o.   MRN: [id number]  HPI  Pt in for recent break out on rt buttuck area. Pt has very itching are for 2 days off and on. Pt is using hydrocortisone. Helps itch but rash still present.   Pt states she has history of left buttock area that will break out on occasion.   In the past pt would use valtrex for left side buttock area when break out would occur.   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 Pt sees Dr. Renda Rolls in [place]. 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 But no history of genital blisters/vesicle.   Next Thursday she is seeing gynecologist.  No change in creams, soaps or detergents.    Review of Systems  Constitutional: Negative for chills, fatigue and fever.  Respiratory: Negative for cough, chest tightness, shortness of breath and wheezing.   Cardiovascular: Negative for chest pain and palpitations.  Gastrointestinal: Negative for abdominal distention and abdominal pain.  Genitourinary: Negative for dysuria, enuresis and flank pain.  Musculoskeletal: Negative for back pain.  Skin: Positive for rash.  Neurological: Negative for facial asymmetry, speech difficulty, weakness and numbness.  Hematological: Negative for adenopathy. Does not bruise/bleed easily.   Past Medical History:  Diagnosis Date  . Celiac disease   . GERD (gastroesophageal reflux disease)   . Hypothyroidism   . Iron deficiency   . Psoriatic arthritis ([place])   . Type 1 diabetes mellitus ([place])      Social History   Socioeconomic History  . Marital status: Married    Spouse name: Not on file  . Number of children: Not on file  . Years of education: Not on file  . Highest education level: Not on file  Occupational History  . Not on file  Tobacco Use  . Smoking status: Never Smoker  . Smokeless tobacco: Never Used  Vaping  Use  . Vaping Use: Never used  Substance and Sexual Activity  . Alcohol use: Yes    Comment: occ  . Drug use: No  . Sexual activity: Not on file  Other Topics Concern  . Not on file  Social History Narrative   Sales executive at [employer]   Sister in [place]   Sister in [place] in [place] from [place] 2016   Married    2 dogs (doberman mix and Chiropractor)   Social Determinants of Health   Financial Resource Strain: Not on file  Food Insecurity: Not on file  Transportation Needs: Not on file  Physical Activity: Not on file  Stress: Not on file  Social Connections: Not on file  Intimate Partner Violence: Not on file    No past surgical history on file.  Family History  Problem Relation Age of Onset  . Heart attack Paternal Grandfather   . Thyroid disease Mother        hx of hypothyroid, resolved  . Eczema Mother   . Hypertension Father   . Hyperlipidemia Father   . Hearing loss Father   . Psoriasis Father   . Asthma Neg Hx   . Allergic rhinitis Neg Hx   . Urticaria Neg Hx   . Immunodeficiency Neg Hx   . Angioedema Neg Hx     No Known Allergies  Current Outpatient Medications on File  Prior to Visit  Medication Sig Dispense Refill  . Continuous Blood Gluc Sensor (DEXCOM G6 SENSOR) MISC Inject 1 sensor to the skin every 10 days for continuous glucose monitoring.    . Continuous Blood Gluc Sensor (DEXCOM G6 SENSOR) MISC Apply topically.    Marland Kitchen doxycycline (VIBRAMYCIN) 50 MG capsule Take 50 mg by mouth 2 (two) times daily.    Marland Kitchen gabapentin (NEURONTIN) 100 MG capsule Take 1 capsule (100 mg total) by mouth at bedtime. 30 capsule 0  . HUMALOG 100 UNIT/ML injection     . hydrocortisone 2.5 % ointment Apply thin layer to affected area TID prn    . Hyoscyamine Sulfate SL 0.125 MG SUBL Take by mouth.    . insulin aspart (NOVOLOG) 100 UNIT/ML injection USE AS DIRECTED IN INSULIN PUMP (MDD: 100 U/DAILY)    . Insulin Disposable Pump (OMNIPOD DASH 5  PACK PODS) MISC USE 1 POD EVERY 3 DAYS AS DIRECTED    . STELARA 45 MG/0.5ML SOSY injection     . valACYclovir (VALTREX) 1000 MG tablet TK 2 TS PO AT ONSET THEN 2 TS PO 12 HOURS LATE PRF OUTBREAKS    . benzonatate (TESSALON) 200 MG capsule Take 1 capsule (200 mg total) by mouth 2 (two) times daily as needed for cough. (Patient not taking: Reported on 05/19/2020) 20 capsule 0  . clindamycin (CLEOCIN T) 1 % external solution  (Patient not taking: Reported on 05/19/2020)    . clobetasol (TEMOVATE) 0.05 % external solution Apply 1 application topically 2 (two) times daily. (Patient not taking: Reported on 05/19/2020)    . dicyclomine (BENTYL) 10 MG capsule Take 1 tab every 6 hours as needed for abdominal cramping. (Patient not taking: Reported on 05/19/2020) 30 capsule 0  . fluticasone (FLONASE) 50 MCG/ACT nasal spray Place 2 sprays into both nostrils daily. (Patient not taking: Reported on 05/19/2020) 16 g 1  . levocetirizine (XYZAL) 5 MG tablet Take 1 tablet (5 mg total) by mouth every evening. (Patient not taking: Reported on 05/19/2020) 30 tablet 2  . levothyroxine (SYNTHROID) 25 MCG tablet TAKE 1 TABLET(25 MCG) BY MOUTH DAILY BEFORE BREAKFAST (Patient not taking: Reported on 05/19/2020) 30 tablet 2  . meloxicam (MOBIC) 15 MG tablet Take 15 mg by mouth daily. For joint pain (Patient not taking: Reported on 05/19/2020)    . ondansetron (ZOFRAN-ODT) 4 MG disintegrating tablet Take 1 tablet (4 mg total) by mouth every 8 (eight) hours as needed for nausea or vomiting. (Patient not taking: Reported on 05/19/2020) 20 tablet 0   No current facility-administered medications on file prior to visit.    BP 120/60 (BP Location: Left Arm, Patient Position: Sitting, Cuff Size: Normal)   Pulse (!) 58   Resp 18   Wt 123 lb 6.4 oz (56 kg)   SpO2 98%   BMI 21.86 kg/m      Objective:   Physical Exam  General- No acute distress. Pleasant patient. Neck- Full range of motion, no jvd Lungs- Clear, even and  unlabored. Heart- regular rate and rhythm.  Rash- rt  buttock area medial region upper  2 cm x 2 cm rash. Pinkish red. No wamth to touch. No vesicles. No induration.       Assessment & Plan:  Your rt buttock area rash appears probable fungal. Does not look viral. Can use nystatin cream twice daily and you hydrocortisone twice daily as well. Try to keep area dry.   If area changes to  indurated/hard feeling, red and tender let  me know and would give antibiotic.  Seeing gynecologist next week.  Hx of diabetes. Has endocrinologist.Discussed could refer to  Dr. Kelton Pillar is you want.   Follow up 2 weeks or as needed.  Mackie Pai, PA-C   Time spent with patient today was 30  minutes which consisted of chart review, discussing diagnosis,  treatment and documentation.

## 2020-05-25 DIAGNOSIS — Z1151 Encounter for screening for human papillomavirus (HPV): Secondary | ICD-10-CM | POA: Diagnosis not present

## 2020-05-25 DIAGNOSIS — N8501 Benign endometrial hyperplasia: Secondary | ICD-10-CM | POA: Diagnosis not present

## 2020-05-25 DIAGNOSIS — Z01419 Encounter for gynecological examination (general) (routine) without abnormal findings: Secondary | ICD-10-CM | POA: Diagnosis not present

## 2020-05-25 DIAGNOSIS — B029 Zoster without complications: Secondary | ICD-10-CM | POA: Diagnosis not present

## 2020-05-26 DIAGNOSIS — B029 Zoster without complications: Secondary | ICD-10-CM | POA: Diagnosis not present

## 2020-06-15 DIAGNOSIS — L400 Psoriasis vulgaris: Secondary | ICD-10-CM | POA: Diagnosis not present

## 2020-06-15 DIAGNOSIS — Z79899 Other long term (current) drug therapy: Secondary | ICD-10-CM | POA: Diagnosis not present

## 2020-06-15 DIAGNOSIS — L4050 Arthropathic psoriasis, unspecified: Secondary | ICD-10-CM | POA: Diagnosis not present

## 2020-06-15 DIAGNOSIS — L259 Unspecified contact dermatitis, unspecified cause: Secondary | ICD-10-CM | POA: Diagnosis not present

## 2020-06-30 ENCOUNTER — Other Ambulatory Visit: Payer: Self-pay | Admitting: Obstetrics & Gynecology

## 2020-06-30 DIAGNOSIS — Z1231 Encounter for screening mammogram for malignant neoplasm of breast: Secondary | ICD-10-CM

## 2020-07-07 DIAGNOSIS — Z3202 Encounter for pregnancy test, result negative: Secondary | ICD-10-CM | POA: Diagnosis not present

## 2020-07-07 DIAGNOSIS — N8501 Benign endometrial hyperplasia: Secondary | ICD-10-CM | POA: Diagnosis not present

## 2020-07-29 DIAGNOSIS — J310 Chronic rhinitis: Secondary | ICD-10-CM | POA: Diagnosis not present

## 2020-07-29 DIAGNOSIS — K529 Noninfective gastroenteritis and colitis, unspecified: Secondary | ICD-10-CM | POA: Diagnosis not present

## 2020-07-29 DIAGNOSIS — U071 COVID-19: Secondary | ICD-10-CM | POA: Diagnosis not present

## 2020-07-29 DIAGNOSIS — J029 Acute pharyngitis, unspecified: Secondary | ICD-10-CM | POA: Diagnosis not present

## 2020-07-29 DIAGNOSIS — Z1159 Encounter for screening for other viral diseases: Secondary | ICD-10-CM | POA: Diagnosis not present

## 2020-08-24 ENCOUNTER — Ambulatory Visit: Payer: BC Managed Care – PPO

## 2020-08-25 DIAGNOSIS — E109 Type 1 diabetes mellitus without complications: Secondary | ICD-10-CM | POA: Diagnosis not present

## 2020-08-25 DIAGNOSIS — Z9641 Presence of insulin pump (external) (internal): Secondary | ICD-10-CM | POA: Diagnosis not present

## 2020-08-25 DIAGNOSIS — E039 Hypothyroidism, unspecified: Secondary | ICD-10-CM | POA: Diagnosis not present

## 2020-08-26 ENCOUNTER — Ambulatory Visit: Payer: BC Managed Care – PPO

## 2020-09-11 ENCOUNTER — Other Ambulatory Visit: Payer: Self-pay

## 2020-09-11 ENCOUNTER — Ambulatory Visit
Admission: RE | Admit: 2020-09-11 | Discharge: 2020-09-11 | Disposition: A | Discharge status: Home / Self Care | Payer: BC Managed Care – PPO | Source: Ambulatory Visit | Attending: Obstetrics & Gynecology | Admitting: Obstetrics & Gynecology

## 2020-09-11 DIAGNOSIS — Z1231 Encounter for screening mammogram for malignant neoplasm of breast: Secondary | ICD-10-CM

## 2020-09-15 DIAGNOSIS — L400 Psoriasis vulgaris: Secondary | ICD-10-CM | POA: Diagnosis not present

## 2020-10-23 ENCOUNTER — Ambulatory Visit: Payer: BC Managed Care – PPO

## 2020-12-14 DIAGNOSIS — L400 Psoriasis vulgaris: Secondary | ICD-10-CM | POA: Diagnosis not present

## 2020-12-14 DIAGNOSIS — Z79899 Other long term (current) drug therapy: Secondary | ICD-10-CM | POA: Diagnosis not present

## 2021-03-09 DIAGNOSIS — Z9641 Presence of insulin pump (external) (internal): Secondary | ICD-10-CM | POA: Diagnosis not present

## 2021-03-09 DIAGNOSIS — E109 Type 1 diabetes mellitus without complications: Secondary | ICD-10-CM | POA: Diagnosis not present

## 2021-03-09 DIAGNOSIS — E039 Hypothyroidism, unspecified: Secondary | ICD-10-CM | POA: Diagnosis not present

## 2021-03-16 DIAGNOSIS — L409 Psoriasis, unspecified: Secondary | ICD-10-CM | POA: Diagnosis not present

## 2021-04-26 DIAGNOSIS — E109 Type 1 diabetes mellitus without complications: Secondary | ICD-10-CM | POA: Diagnosis not present

## 2021-05-10 DIAGNOSIS — K900 Celiac disease: Secondary | ICD-10-CM | POA: Diagnosis not present

## 2021-05-10 DIAGNOSIS — R1010 Upper abdominal pain, unspecified: Secondary | ICD-10-CM | POA: Diagnosis not present

## 2021-05-14 DIAGNOSIS — R1011 Right upper quadrant pain: Secondary | ICD-10-CM | POA: Diagnosis not present

## 2021-05-21 ENCOUNTER — Ambulatory Visit (INDEPENDENT_AMBULATORY_CARE_PROVIDER_SITE_OTHER): Payer: BC Managed Care – PPO | Admitting: Family Medicine

## 2021-05-21 ENCOUNTER — Telehealth: Payer: Self-pay | Admitting: Family Medicine

## 2021-05-21 ENCOUNTER — Encounter: Payer: Self-pay | Admitting: Family Medicine

## 2021-05-21 VITALS — BP 120/72 | HR 83 | Temp 99.9°F | Ht 62.0 in | Wt 128.0 lb

## 2021-05-21 DIAGNOSIS — R6889 Other general symptoms and signs: Secondary | ICD-10-CM

## 2021-05-21 DIAGNOSIS — L4050 Arthropathic psoriasis, unspecified: Secondary | ICD-10-CM | POA: Diagnosis not present

## 2021-05-21 DIAGNOSIS — E1065 Type 1 diabetes mellitus with hyperglycemia: Secondary | ICD-10-CM | POA: Diagnosis not present

## 2021-05-21 MED ORDER — OSELTAMIVIR PHOSPHATE 75 MG PO CAPS: 75.0000 mg | ORAL_CAPSULE | Freq: Two times a day (BID) | ORAL | 0 refills | Status: AC

## 2021-05-21 MED ORDER — BENZONATATE 200 MG PO CAPS: 200.0000 mg | ORAL_CAPSULE | Freq: Two times a day (BID) | ORAL | 0 refills | Status: DC | PRN

## 2021-05-21 NOTE — Progress Notes (Signed)
Chief Complaint  Patient presents with   Cough   Sore Throat    Nausea Covid and flu test negative 05/21/21 done at work.    Sarah Flynn here for [name] complaints.  Duration: 2 days  Associated symptoms: Fever (99.6 F), sinus congestion, rhinorrhea, ear pain, sore throat, myalgia, and coughing, diarrhea, nausea Denies: sinus pain, itchy watery eyes, ear drainage, wheezing, shortness of breath, and vomiting, diarrhea, loss of taste/smell Treatment to date: Tylenol, Mucinex Sick contacts: Yes; co-workers Tested neg for flu and covid x 2.  Past Medical History:  Diagnosis Date   Celiac disease    GERD (gastroesophageal reflux disease)    Hypothyroidism    Iron deficiency    Psoriatic arthritis (HCC)    Type 1 diabetes mellitus (HCC)     Objective BP 120/72    Pulse 83    Temp 99.9 F (37.7 C) (Oral)    Ht 5' 2"  (1.575 m)    Wt 128 lb (58.1 kg)    SpO2 99%    BMI 23.41 kg/m  General: Awake, alert, appears stated age 41: AT, [place], ears patent b/l and TM's neg, nares patent w/o discharge, pharynx pink and without exudates, MMM Neck: No masses or asymmetry Heart: RRR Lungs: CTAB, no accessory muscle use Psych: Age appropriate judgment and insight, normal mood and affect  Flu-like symptoms - Plan: oseltamivir (TAMIFLU) 75 MG capsule, benzonatate (TESSALON) 200 MG capsule  Psoriatic arthritis (HCC), Chronic  Type 1 diabetes mellitus with hyperglycemia (HCC)  Empirically tx for flu w Tamiflu. Benzonatate prn. Continue to push fluids, practice good hand hygiene, cover mouth when coughing. F/u prn. If starting to experience fevers, shaking, or shortness of breath, seek immediate care. Pt voiced understanding and agreement to the plan.  [place], DO 05/21/21 1:29 PM

## 2021-05-21 NOTE — Patient Instructions (Addendum)
Continue to push fluids, practice good hand hygiene, and cover your mouth if you cough.  If you start having fevers, shaking or shortness of breath, seek immediate care.   For the swelling in your lower extremities, be sure to elevate your legs when able, mind the salt intake, stay physically active and consider wearing compression stockings.  OK to take Tylenol 1000 mg (2 extra strength tabs) or 975 mg (3 regular strength tabs) every 6 hours as needed.  Consider throat lozenges, salt water gargles and an air humidifier for symptomatic care.   Use Zofran as needed.   Let us know if you need anything.

## 2021-05-29 NOTE — Telephone Encounter (Signed)
Opened in error

## 2021-06-19 DIAGNOSIS — L400 Psoriasis vulgaris: Secondary | ICD-10-CM | POA: Diagnosis not present

## 2021-07-12 DIAGNOSIS — R1010 Upper abdominal pain, unspecified: Secondary | ICD-10-CM | POA: Diagnosis not present

## 2021-07-12 DIAGNOSIS — R1011 Right upper quadrant pain: Secondary | ICD-10-CM | POA: Diagnosis not present

## 2021-08-12 IMAGING — MG MM DIGITAL SCREENING BILAT W/ TOMO AND CAD
8 series · 9 of 24 positions shown · non-contrast
Comparison: None.

CLINICAL DATA: Screening.

EXAM:
DIGITAL SCREENING BILATERAL MAMMOGRAM WITH TOMOSYNTHESIS AND CAD
TECHNIQUE: Bilateral screening digital craniocaudal and mediolateral oblique
mammograms were obtained. Bilateral screening digital breast
tomosynthesis was performed. The images were evaluated with
computer-aided detection.

[R MLO synth-2D]
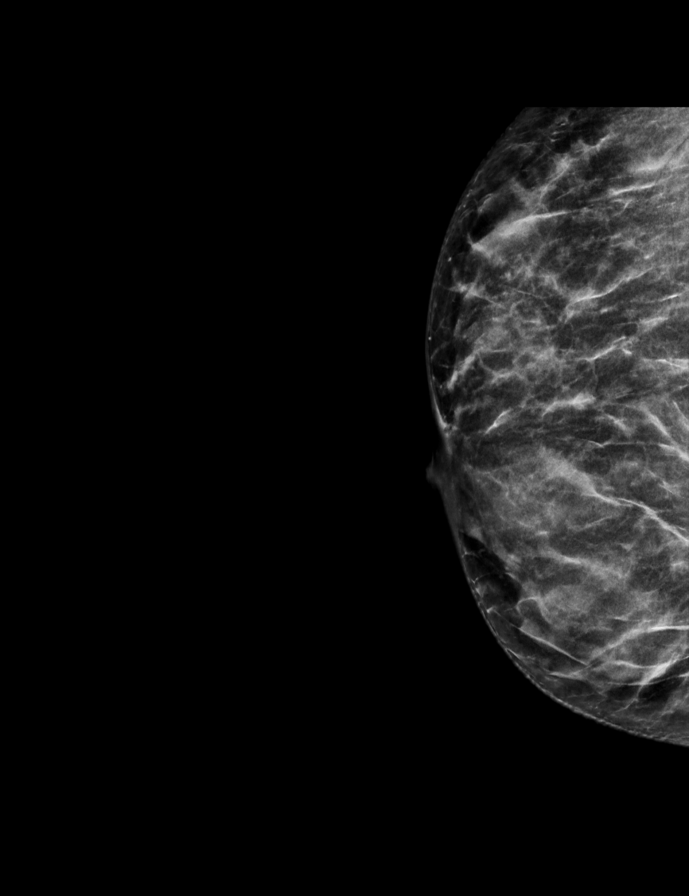

[L CC synth-2D]
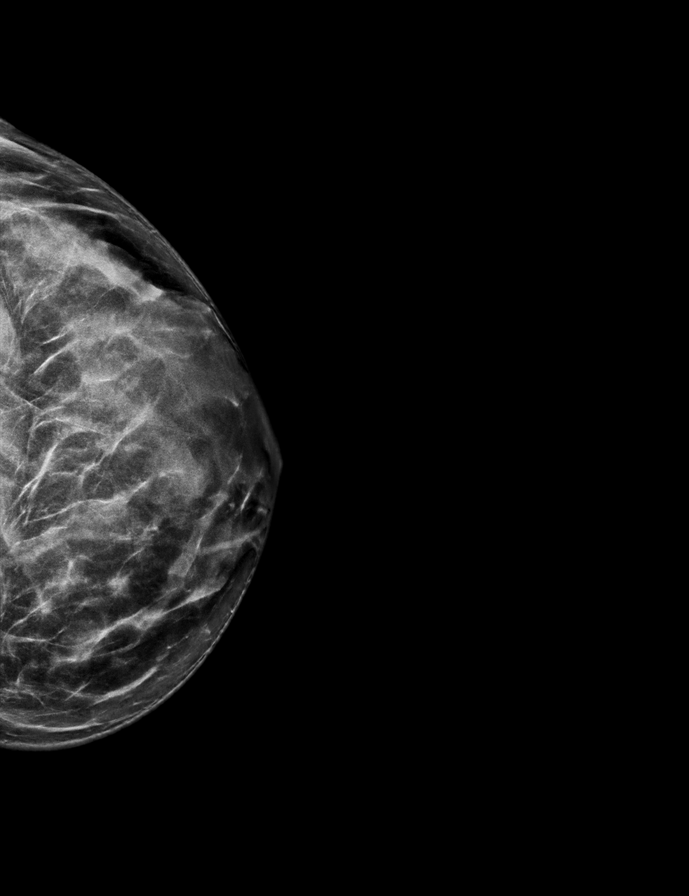

[R CC synth-2D]
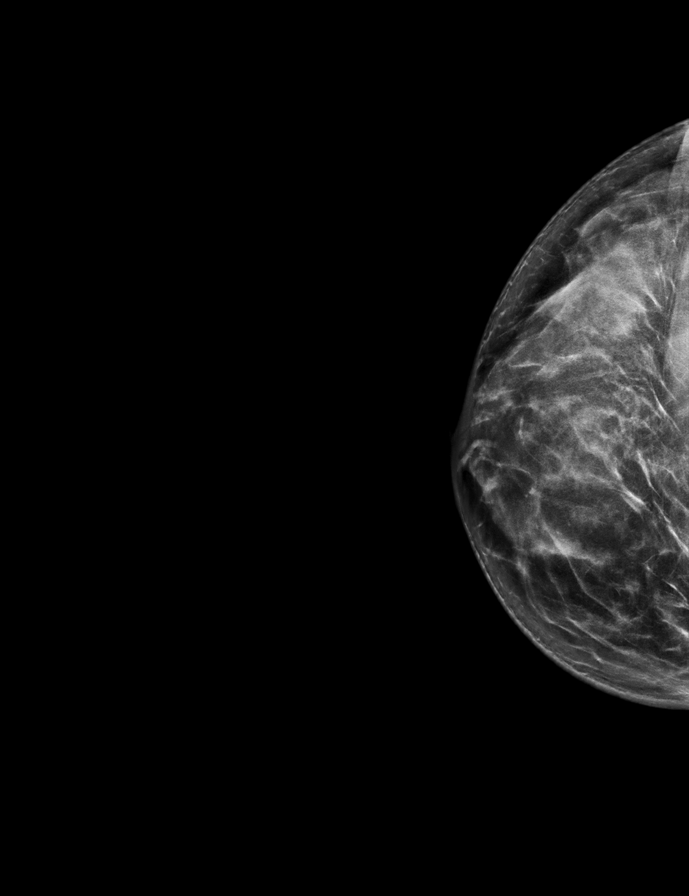

[L MLO synth-2D]
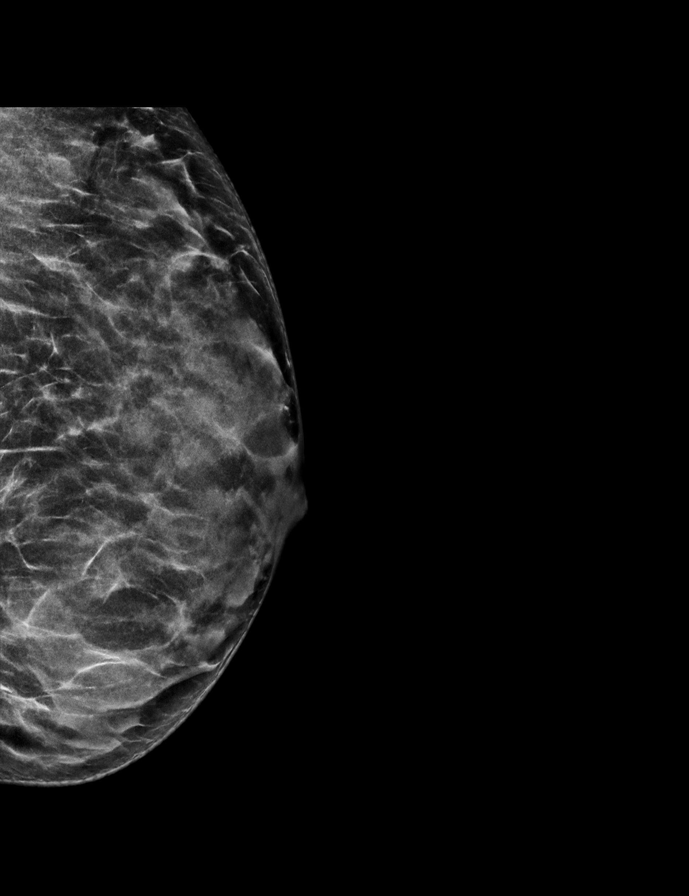

[R MLO tomo · 2 of 52 frames shown]
[frame 17/52]
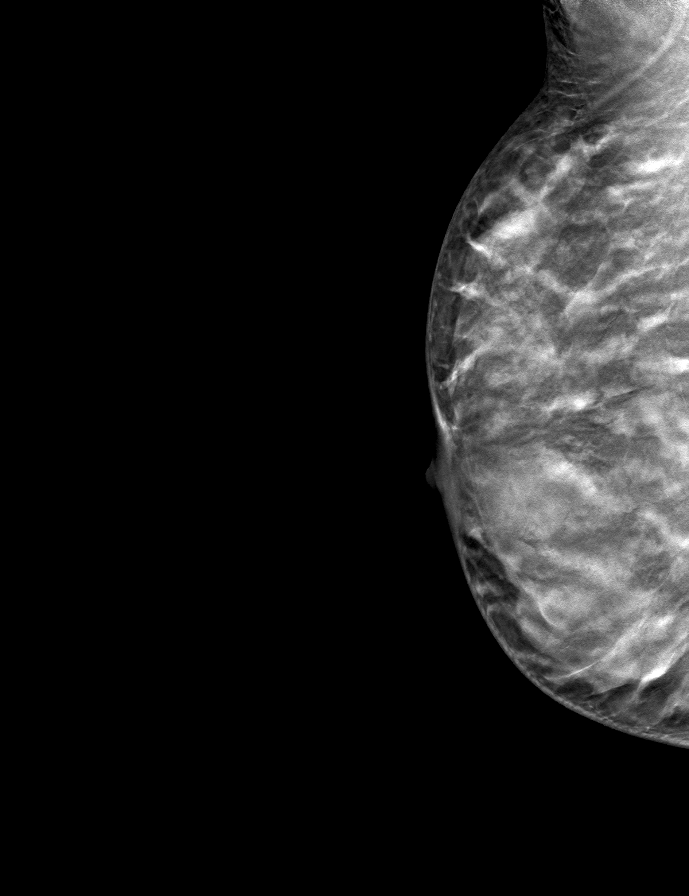
[frame 27/52]
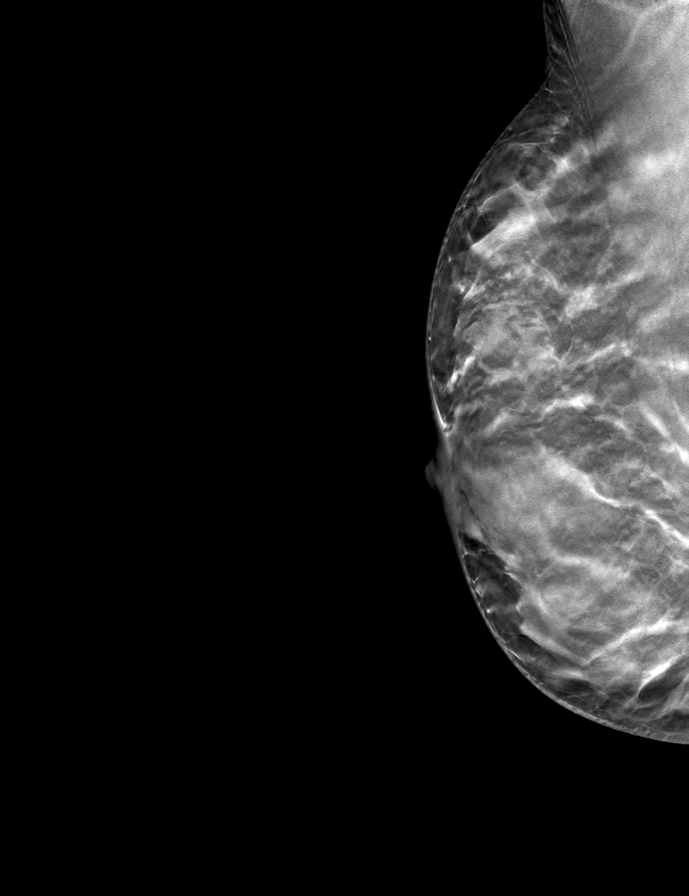

[L CC tomo · tomo slice 33/64.0]
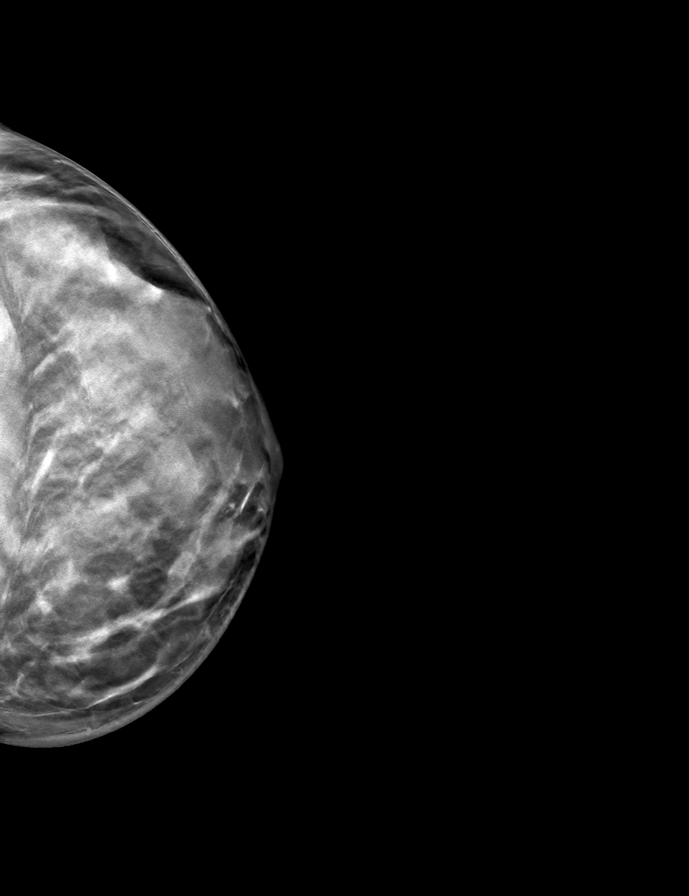

[L MLO tomo · tomo slice 27/54.0]
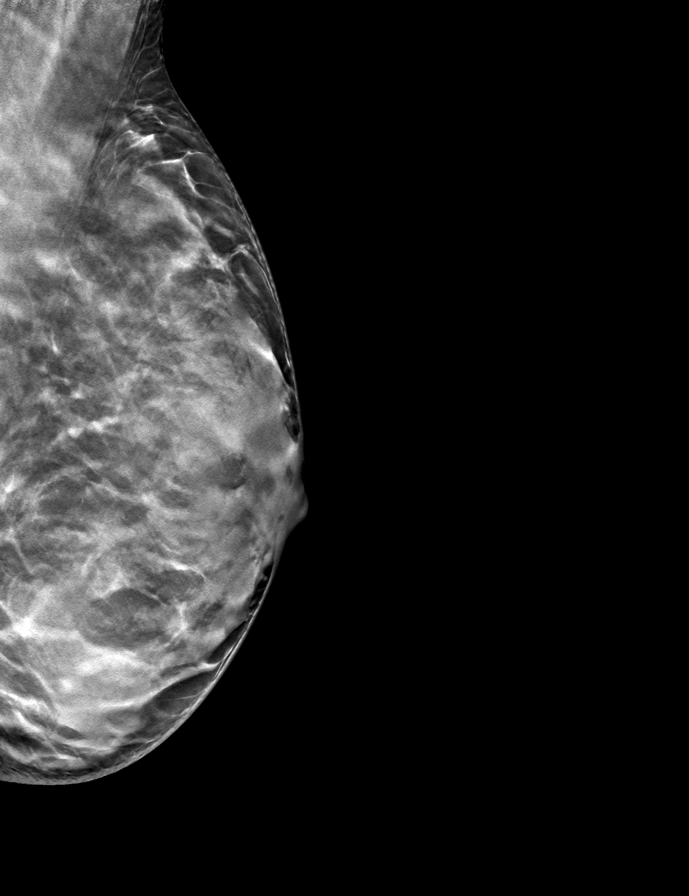

[R CC tomo · tomo slice 34/67.0]
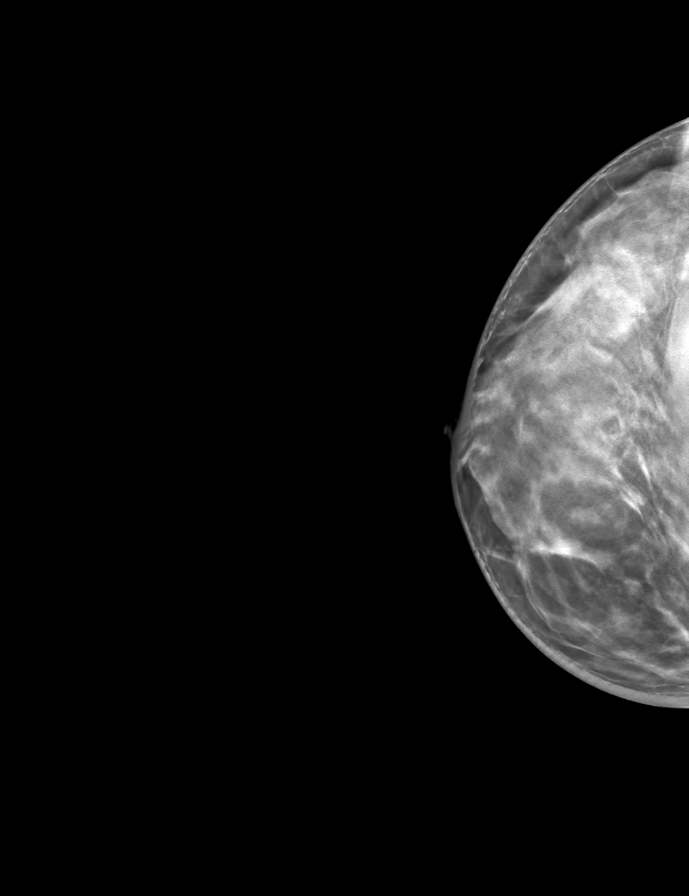

[9 of 24 positions shown; findings below may reference images not displayed]

ACR Breast Density Category d: The breast tissue is extremely dense,
which lowers the sensitivity of mammography.
FINDINGS: There are no findings suspicious for malignancy. The images were
evaluated with computer-aided detection.
IMPRESSION: No mammographic evidence of malignancy. A result letter of this
screening mammogram will be mailed directly to the patient.

RECOMMENDATION:
Screening mammogram in one year. (Code:CA-B-HJO)

BI-RADS CATEGORY  1: Negative.

## 2021-08-16 ENCOUNTER — Telehealth: Payer: Self-pay | Admitting: Internal Medicine

## 2021-08-16 NOTE — Telephone Encounter (Signed)
Error

## 2021-08-17 ENCOUNTER — Ambulatory Visit (INDEPENDENT_AMBULATORY_CARE_PROVIDER_SITE_OTHER): Payer: BC Managed Care – PPO | Admitting: Internal Medicine

## 2021-08-17 ENCOUNTER — Other Ambulatory Visit (HOSPITAL_COMMUNITY)
Admission: RE | Admit: 2021-08-17 | Discharge: 2021-08-17 | Disposition: A | Discharge status: Home / Self Care | Payer: BC Managed Care – PPO | Source: Ambulatory Visit | Attending: Internal Medicine | Admitting: Internal Medicine

## 2021-08-17 ENCOUNTER — Other Ambulatory Visit: Payer: Self-pay

## 2021-08-17 ENCOUNTER — Encounter: Payer: Self-pay | Admitting: Internal Medicine

## 2021-08-17 VITALS — BP 119/75 | HR 74 | Temp 98.5°F | Resp 16 | Wt 128.0 lb

## 2021-08-17 DIAGNOSIS — A64 Unspecified sexually transmitted disease: Secondary | ICD-10-CM | POA: Insufficient documentation

## 2021-08-17 DIAGNOSIS — Z113 Encounter for screening for infections with a predominantly sexual mode of transmission: Secondary | ICD-10-CM | POA: Diagnosis not present

## 2021-08-17 DIAGNOSIS — A539 Syphilis, unspecified: Secondary | ICD-10-CM

## 2021-08-17 DIAGNOSIS — Z1159 Encounter for screening for other viral diseases: Secondary | ICD-10-CM | POA: Diagnosis not present

## 2021-08-17 MED ORDER — PENICILLIN G BENZATHINE 1200000 UNIT/2ML IM SUSY
2.4000 10*6.[IU] | PREFILLED_SYRINGE | Freq: Once | INTRAMUSCULAR | Status: AC
  Administered 2021-08-17: 2.4 10*6.[IU] via INTRAMUSCULAR

## 2021-08-17 NOTE — Addendum Note (Signed)
Addended byLaurice Record on: 08/17/2021 10:27 AM ? ? Modules accepted: Level of Service ? ?

## 2021-08-17 NOTE — Addendum Note (Signed)
Addended byLaurice Record on: 08/17/2021 10:21 AM ? ? Modules accepted: Orders ? ?

## 2021-08-17 NOTE — Progress Notes (Addendum)
? ?   ? ? ? ? ?Patient Active Problem List  ? Diagnosis Date Noted  ? Rash 05/19/2020  ? History of COVID-19 08/06/2019  ? Cough 08/06/2019  ? Chronic bilateral thoracic back pain 08/06/2019  ? Irregular bleeding 02/16/2019  ? Perianal itch 02/16/2019  ? Other allergic rhinitis 08/03/2018  ? Iron deficiency 06/26/2018  ? Insulin dependent diabetes mellitus   ? Psoriatic arthritis ([place]) 02/25/2017  ? Fear of flying 02/25/2017  ? Celiac disease 02/25/2017  ? Psoriasis 02/11/2017  ? Acne 02/11/2017  ? Finger infection 02/04/2017  ? Tenosynovitis of right hand 02/03/2017  ? Gastroesophageal reflux disease without esophagitis   ? Hypothyroidism   ? Fatigue 01/20/2015  ? ? ?Patient's Medications  ?New Prescriptions  ? No medications on file  ?Previous Medications  ? BENZONATATE (TESSALON) 200 MG CAPSULE    Take 1 capsule (200 mg total) by mouth 2 (two) times daily as needed for cough.  ? CLINDAMYCIN (CLEOCIN T) 1 % EXTERNAL SOLUTION      ? CLOBETASOL (TEMOVATE) 0.05 % EXTERNAL SOLUTION    Apply 1 application topically 2 (two) times daily.  ? CONTINUOUS BLOOD GLUC SENSOR (DEXCOM G6 SENSOR) MISC    Inject 1 sensor to the skin every 10 days for continuous glucose monitoring.  ? CONTINUOUS BLOOD GLUC SENSOR (DEXCOM G6 SENSOR) MISC    Apply topically.  ? DIPHENOXYLATE-ATROPINE (LOMOTIL) 2.5-0.025 MG TABLET    Take 1 tablet by mouth 4 (four) times daily.  ? DOXYCYCLINE (VIBRAMYCIN) 50 MG CAPSULE    Take 50 mg by mouth 2 (two) times daily.  ? GABAPENTIN (NEURONTIN) 100 MG CAPSULE    Take 1 capsule (100 mg total) by mouth at bedtime.  ? HYDROCORTISONE 2.5 % OINTMENT    Apply thin layer to affected area TID prn  ? HYOSCYAMINE SULFATE SL 0.125 MG SUBL    Take by mouth.  ? INSULIN ASPART (NOVOLOG) 100 UNIT/ML INJECTION    USE AS DIRECTED IN INSULIN PUMP (MDD: 100 U/DAILY)  ? INSULIN DISPOSABLE PUMP (OMNIPOD DASH 5 PACK PODS) MISC    USE 1 POD EVERY 3 DAYS AS DIRECTED  ? MELOXICAM (MOBIC) 15 MG TABLET    Take 15 mg by mouth daily.  For joint pain  ? ONDANSETRON (ZOFRAN-ODT) 4 MG DISINTEGRATING TABLET    Take 1 tablet (4 mg total) by mouth every 8 (eight) hours as needed for nausea or vomiting.  ? STELARA 45 MG/0.5ML SOSY INJECTION      ? VALACYCLOVIR (VALTREX) 1000 MG TABLET      ? VALACYCLOVIR (VALTREX) 1000 MG TABLET    Take 1 tablet (1,000 mg total) by mouth 2 (two) times daily.  ?Modified Medications  ? No medications on file  ?Discontinued Medications  ? No medications on file  ? ? ?Subjective: ?43 YF with PMHx as below presents for STI testing. Her husband was recently treated for  syphilis. He had rash all over 4 weeks ago. Pt has been sexually active with her husband about 8 months ago. Husband was sexually active with a partner in January, 2023. Pt was STI tested in her mid-20s and was negative, unsure what the testing was. She has 2 partners in the last 3 years.  ? ?Review of Systems: ?Review of Systems  ?All other systems reviewed and are negative. ? ?Past Medical History:  ?Diagnosis Date  ? Celiac disease   ? GERD (gastroesophageal reflux disease)   ? Hypothyroidism   ? Iron deficiency   ?  Psoriatic arthritis ([place])   ? Type 1 diabetes mellitus ([place])   ? ? ?Social History  ? ?Tobacco Use  ? Smoking status: Never  ? Smokeless tobacco: Never  ?Vaping Use  ? Vaping Use: Never used  ?Substance Use Topics  ? Alcohol use: Yes  ?  Comment: occ  ? Drug use: No  ? ? ?Family History  ?Problem Relation Age of Onset  ? Heart attack Paternal Grandfather   ? Thyroid disease Mother   ?     hx of hypothyroid, resolved  ? Eczema Mother   ? Hypertension Father   ? Hyperlipidemia Father   ? Hearing loss Father   ? Psoriasis Father   ? Asthma Neg Hx   ? Allergic rhinitis Neg Hx   ? Urticaria Neg Hx   ? Immunodeficiency Neg Hx   ? Angioedema Neg Hx   ? ? ?No Known Allergies ? ?Health Maintenance  ?Topic Date Due  ? Hepatitis C Screening  Never done  ? URINE MICROALBUMIN  06/26/2019  ? PAP SMEAR-Modifier  07/08/2019  ? TETANUS/TDAP  01/07/2020  ?  INFLUENZA VACCINE  11/06/2021  ? HIV Screening  Completed  ? HPV VACCINES  Aged Out  ? FOOT EXAM  Discontinued  ? HEMOGLOBIN A1C  Discontinued  ? OPHTHALMOLOGY EXAM  Discontinued  ? COVID-19 Vaccine  Discontinued  ? ? ?Objective: ? ?Vitals:  ? 08/17/21 0944  ?Weight: 128 lb (58.1 kg)  ? ?Body mass index is 23.41 kg/m?. ? ?Physical Exam ?Constitutional:   ?   Appearance: Normal appearance.  ?HENT:  ?   Head: Normocephalic and atraumatic.  ?   Right Ear: Tympanic membrane normal.  ?   Left Ear: Tympanic membrane normal.  ?   Nose: Nose normal.  ?   Mouth/Throat:  ?   Mouth: Mucous membranes are moist.  ?Eyes:  ?   Extraocular Movements: Extraocular movements intact.  ?   Conjunctiva/sclera: Conjunctivae normal.  ?   Pupils: Pupils are equal, round, and reactive to light.  ?Cardiovascular:  ?   Rate and Rhythm: Normal rate and regular rhythm.  ?   Heart sounds: No murmur heard. ?  No friction rub. No gallop.  ?Pulmonary:  ?   Effort: Pulmonary effort is normal.  ?   Breath sounds: Normal breath sounds.  ?Abdominal:  ?   General: Abdomen is flat.  ?   Palpations: Abdomen is soft.  ?Genitourinary: ?   Rectum: Normal.  ?   Comments: Left buttock dexa-com ?Musculoskeletal:     ?   General: Normal range of motion.  ?Skin: ?   General: Skin is warm and dry.  ?Neurological:  ?   General: No focal deficit present.  ?   Mental Status: She is alert and oriented to person, place, and time.  ?Psychiatric:     ?   Mood and Affect: Mood normal.  ? ? ?Lab Results ?Lab Results  ?Component Value Date  ? WBC 5.1 08/06/2019  ? HGB 13.1 08/06/2019  ? HCT 38.9 08/06/2019  ? MCV 91 08/06/2019  ? PLT 286 08/06/2019  ?  ?Lab Results  ?Component Value Date  ? CREATININE 0.80 08/06/2019  ? BUN 10 08/06/2019  ? NA 140 08/06/2019  ? K 4.4 08/06/2019  ? CL 102 08/06/2019  ? CO2 25 08/06/2019  ?  ?Lab Results  ?Component Value Date  ? ALT 23 08/06/2019  ? AST 17 08/06/2019  ? ALKPHOS 31 (L) 08/06/2019  ? BILITOT 0.3  08/06/2019  ?  ?Lab Results   ?Component Value Date  ? CHOL 129 01/01/2019  ? HDL 60.50 01/01/2019  ? [place] 52 01/01/2019  ? TRIG 80.0 01/01/2019  ? CHOLHDL 2 01/01/2019  ? ?No results found for: LABRPR, RPRTITER ?No results found for: HIV1RNAQUANT, HIV1RNAVL, CD4TABS ?#STI exposure ?-Husband recently treated for syphilis ?-Discussed PREP ?-STI testing: RPR, HIV, GC urine and oral/rectal. Hep panel ?- Empiric syphilis treatment with PEN 2.4 MU. 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 If results are positive pt plans to return for treatment ? ? ?# Hx of  gluteal  rash  ?- She reports rash due to toilet paper for years. No rash noted today ? ? ? ?Laurice Record, MD ?[facility] ?[facility] ?08/17/2021, 9:47 AM  ?

## 2021-08-17 NOTE — Addendum Note (Signed)
Addended by: Caffie Pinto on: 08/17/2021 10:44 AM ? ? Modules accepted: Orders ? ?

## 2021-08-17 NOTE — Addendum Note (Signed)
Addended by: Tomi Bamberger on: 08/17/2021 12:12 PM ? ? Modules accepted: Orders ? ?

## 2021-08-20 LAB — URINE CYTOLOGY ANCILLARY ONLY
Chlamydia: NEGATIVE
Comment: NEGATIVE
Comment: NEGATIVE
Comment: NORMAL
Neisseria Gonorrhea: NEGATIVE
Trichomonas: NEGATIVE

## 2021-08-20 LAB — CYTOLOGY, (ORAL, ANAL, URETHRAL) ANCILLARY ONLY
Chlamydia: NEGATIVE
Chlamydia: NEGATIVE
Comment: NEGATIVE
Comment: NEGATIVE
Comment: NORMAL
Comment: NORMAL
Neisseria Gonorrhea: NEGATIVE
Neisseria Gonorrhea: NEGATIVE

## 2021-08-20 LAB — RPR: RPR Ser Ql: NONREACTIVE

## 2021-08-20 LAB — HEPATITIS C ANTIBODY
Hepatitis C Ab: NONREACTIVE
SIGNAL TO CUT-OFF: 0.11 (ref <1.00)

## 2021-08-20 LAB — HEPATITIS A ANTIBODY, TOTAL: Hepatitis A AB,Total: NONREACTIVE

## 2021-08-20 LAB — HIV ANTIBODY (ROUTINE TESTING W REFLEX): HIV 1&2 Ab, 4th Generation: NONREACTIVE

## 2021-08-20 LAB — HEPATITIS B SURFACE ANTIGEN: Hepatitis B Surface Ag: NONREACTIVE

## 2021-08-20 LAB — HEPATITIS B SURFACE ANTIBODY,QUALITATIVE: Hep B S Ab: NONREACTIVE

## 2021-09-13 DIAGNOSIS — Z4681 Encounter for fitting and adjustment of insulin pump: Secondary | ICD-10-CM | POA: Diagnosis not present

## 2021-09-13 DIAGNOSIS — Z9641 Presence of insulin pump (external) (internal): Secondary | ICD-10-CM | POA: Diagnosis not present

## 2021-09-13 DIAGNOSIS — E109 Type 1 diabetes mellitus without complications: Secondary | ICD-10-CM | POA: Diagnosis not present

## 2021-09-13 DIAGNOSIS — E039 Hypothyroidism, unspecified: Secondary | ICD-10-CM | POA: Diagnosis not present

## 2021-11-01 DIAGNOSIS — K900 Celiac disease: Secondary | ICD-10-CM | POA: Diagnosis not present

## 2021-11-01 DIAGNOSIS — K219 Gastro-esophageal reflux disease without esophagitis: Secondary | ICD-10-CM | POA: Diagnosis not present

## 2021-11-01 DIAGNOSIS — R1010 Upper abdominal pain, unspecified: Secondary | ICD-10-CM | POA: Diagnosis not present

## 2021-12-17 DIAGNOSIS — L400 Psoriasis vulgaris: Secondary | ICD-10-CM | POA: Diagnosis not present

## 2021-12-17 DIAGNOSIS — L4050 Arthropathic psoriasis, unspecified: Secondary | ICD-10-CM | POA: Diagnosis not present

## 2021-12-17 DIAGNOSIS — Z79899 Other long term (current) drug therapy: Secondary | ICD-10-CM | POA: Diagnosis not present

## 2022-02-26 ENCOUNTER — Encounter: Payer: Self-pay | Admitting: Family Medicine

## 2022-02-26 ENCOUNTER — Other Ambulatory Visit: Payer: Self-pay | Admitting: Family Medicine

## 2022-02-26 ENCOUNTER — Ambulatory Visit: Payer: BC Managed Care – PPO | Admitting: Family Medicine

## 2022-02-26 VITALS — BP 108/70 | HR 64 | Temp 98.5°F | Ht 67.0 in | Wt 127.2 lb

## 2022-02-26 DIAGNOSIS — R413 Other amnesia: Secondary | ICD-10-CM | POA: Diagnosis not present

## 2022-02-26 DIAGNOSIS — F411 Generalized anxiety disorder: Secondary | ICD-10-CM

## 2022-02-26 DIAGNOSIS — E559 Vitamin D deficiency, unspecified: Secondary | ICD-10-CM | POA: Diagnosis not present

## 2022-02-26 LAB — TSH: TSH: 2.44 u[IU]/mL (ref 0.35–5.50)

## 2022-02-26 LAB — COMPREHENSIVE METABOLIC PANEL
ALT: 12 U/L (ref 0–35)
AST: 14 U/L (ref 0–37)
Albumin: 4.3 g/dL (ref 3.5–5.2)
Alkaline Phosphatase: 31 U/L — ABNORMAL LOW (ref 39–117)
BUN: 13 mg/dL (ref 6–23)
CO2: 30 mEq/L (ref 19–32)
Calcium: 9 mg/dL (ref 8.4–10.5)
Chloride: 100 mEq/L (ref 96–112)
Creatinine, Ser: 0.72 mg/dL (ref 0.40–1.20)
GFR: 102.14 mL/min (ref >60.00)
Glucose, Bld: 66 mg/dL — ABNORMAL LOW (ref 70–99)
Potassium: 3.9 mEq/L (ref 3.5–5.1)
Sodium: 136 mEq/L (ref 135–145)
Total Bilirubin: 0.4 mg/dL (ref 0.2–1.2)
Total Protein: 7.4 g/dL (ref 6.0–8.3)

## 2022-02-26 LAB — CBC
HCT: 39 % (ref 36.0–46.0)
Hemoglobin: 13.1 g/dL (ref 12.0–15.0)
MCHC: 33.7 g/dL (ref 30.0–36.0)
MCV: 89.3 fl (ref 78.0–100.0)
Platelets: 310 10*3/uL (ref 150.0–400.0)
RBC: 4.37 Mil/uL (ref 3.87–5.11)
RDW: 13.1 % (ref 11.5–15.5)
WBC: 7.2 10*3/uL (ref 4.0–10.5)

## 2022-02-26 LAB — VITAMIN B12: Vitamin B-12: 322 pg/mL (ref 211–911)

## 2022-02-26 LAB — VITAMIN D 25 HYDROXY (VIT D DEFICIENCY, FRACTURES): VITD: 29.73 ng/mL — ABNORMAL LOW (ref 30.00–100.00)

## 2022-02-26 MED ORDER — CITALOPRAM HYDROBROMIDE 10 MG PO TABS: 10.0000 mg | ORAL_TABLET | Freq: Every day | ORAL | 3 refills | Status: DC

## 2022-02-26 NOTE — Patient Instructions (Signed)
Keep the diet clean and stay active.  Aim to do some physical exertion for 150 minutes per week. This is typically divided into 5 days per week, 30 minutes per day. The activity should be enough to get your heart rate up. Anything is better than nothing if you have time constraints.  Coping skills Choose 5 that work for you: Take a deep breath Count to 20 Read a book Do a puzzle Meditate Bake Sing Knit Garden Pray Go outside Call a friend Listen to music Take a walk Color Send a note Take a bath Watch a movie Be alone in a quiet place Pet an animal Visit a friend Journal Exercise Stretch   Let us know if you need anything.

## 2022-02-26 NOTE — Progress Notes (Signed)
Chief Complaint  Patient presents with   Follow-up    Memory problems Check blood sugar     Subjective: Patient is a 41 y.o. female here for memory issues.  Pt has been having trouble remembering things over the past couple weeks. No sig famhx of early-onset dementia. Having a decent amount of stress at work. Husband's health issues are also a source of stress. She gets around 6-7 hrs of sleep nightly. Diet could be better. Hx of DMI, concerned this could be playing a role.   Past Medical History:  Diagnosis Date   Celiac disease    GERD (gastroesophageal reflux disease)    Hypothyroidism    Iron deficiency    Psoriatic arthritis ([place])    Type 1 diabetes mellitus (HCC)     Objective: BP 108/70 (BP Location: Left Arm, Patient Position: Sitting, Cuff Size: Normal)   Pulse 64   Temp 98.5 F (36.9 C) (Oral)   Ht 5' 7"  (1.702 m)   Wt 127 lb 4 oz (57.7 kg)   SpO2 99%   BMI 19.93 kg/m  General: Awake, appears stated age Heart: RRR, no LE edema Lungs: CTAB, no rales, wheezes or rhonchi. No accessory muscle use Neuro: DTRs hyperreflexic at the patellar level, no cerebellar signs, gait is normal Psych: Age appropriate judgment and insight, normal affect and mood  Assessment and Plan: Memory deficit - Plan: TSH, CBC, Comprehensive metabolic panel, R42  Vitamin D insufficiency - Plan: VITAMIN D 25 Hydroxy (Vit-D Deficiency, Fractures)  GAD (generalized anxiety disorder)  Check above labs.  If any abnormality, we will proceed accordingly. Return unremarkable family history, it is unlikely this is anything related to dementia or cog impairment.  More likely related to anxiety. If her labs are normal, will send in Celexa 10 mg daily and have her follow-up in 6 weeks.   The patient voiced understanding and agreement to the plan.  [place], DO 02/26/22  3:24 PM

## 2022-03-05 ENCOUNTER — Encounter: Payer: Self-pay | Admitting: Family Medicine

## 2022-03-06 ENCOUNTER — Encounter: Payer: Self-pay | Admitting: Family Medicine

## 2022-03-19 DIAGNOSIS — Z9641 Presence of insulin pump (external) (internal): Secondary | ICD-10-CM | POA: Diagnosis not present

## 2022-03-19 DIAGNOSIS — E039 Hypothyroidism, unspecified: Secondary | ICD-10-CM | POA: Diagnosis not present

## 2022-03-19 DIAGNOSIS — E109 Type 1 diabetes mellitus without complications: Secondary | ICD-10-CM | POA: Diagnosis not present

## 2022-05-06 DIAGNOSIS — R1010 Upper abdominal pain, unspecified: Secondary | ICD-10-CM | POA: Diagnosis not present

## 2022-05-06 DIAGNOSIS — K900 Celiac disease: Secondary | ICD-10-CM | POA: Diagnosis not present

## 2022-05-06 DIAGNOSIS — K219 Gastro-esophageal reflux disease without esophagitis: Secondary | ICD-10-CM | POA: Diagnosis not present

## 2022-06-17 DIAGNOSIS — L400 Psoriasis vulgaris: Secondary | ICD-10-CM | POA: Diagnosis not present

## 2022-06-17 DIAGNOSIS — Z79899 Other long term (current) drug therapy: Secondary | ICD-10-CM | POA: Diagnosis not present

## 2022-07-02 ENCOUNTER — Encounter: Payer: Self-pay | Admitting: Family Medicine

## 2022-07-02 ENCOUNTER — Ambulatory Visit (INDEPENDENT_AMBULATORY_CARE_PROVIDER_SITE_OTHER): Payer: BC Managed Care – PPO | Admitting: Family Medicine

## 2022-07-02 VITALS — BP 110/70 | HR 75 | Temp 98.3°F | Ht 62.5 in | Wt 127.5 lb

## 2022-07-02 DIAGNOSIS — E559 Vitamin D deficiency, unspecified: Secondary | ICD-10-CM | POA: Diagnosis not present

## 2022-07-02 DIAGNOSIS — Z79899 Other long term (current) drug therapy: Secondary | ICD-10-CM | POA: Diagnosis not present

## 2022-07-02 DIAGNOSIS — L400 Psoriasis vulgaris: Secondary | ICD-10-CM

## 2022-07-02 DIAGNOSIS — A084 Viral intestinal infection, unspecified: Secondary | ICD-10-CM

## 2022-07-02 LAB — POC COVID19 BINAXNOW: SARS Coronavirus 2 Ag: NEGATIVE

## 2022-07-02 NOTE — Addendum Note (Signed)
Addended by: Sharon Seller B on: 07/02/2022 04:50 PM   Modules accepted: Orders

## 2022-07-02 NOTE — Patient Instructions (Addendum)
Continue to push fluids, practice good hand hygiene, and cover your mouth if you cough.  If you start having fevers, shaking or shortness of breath, seek immediate care.  As long as you are vomiting or having diarrhea, please drink fluids with electrolytes like Gatorade, Powerade, or Pedialyte (if you can stomach the taste). Drink these along with water mainly.   OK to take Tylenol 1000 mg (2 extra strength tabs) or 975 mg (3 regular strength tabs) every 6 hours as needed.  Use the hyoscyamine as needed.   Use the Zofran as needed.  Let us know if you need anything.

## 2022-07-02 NOTE — Progress Notes (Signed)
Chief Complaint  Patient presents with   Nausea    Abdominal pain Diarrhea      Subjective Sarah Flynn is a 42 y.o. female who presents with vomiting and diarrhea.  She is here with her spouse. Symptoms began yesterday.  Patient has abdominal pain, cramping, vomiting, diarrhea, fever, myalgias, and [name] symptoms Patient denies cough and bleeding Treatment to date: [place] Sick contacts: contact at work w/ similar symptoms Tested neg for covid x 2.   Past Medical History:  Diagnosis Date   Celiac disease    GERD (gastroesophageal reflux disease)    Hypothyroidism    Iron deficiency    Psoriatic arthritis ([place])    Type 1 diabetes mellitus (HCC)     Exam BP 110/70 (BP Location: Right Arm, Patient Position: Sitting, Cuff Size: Normal)   Pulse 75   Temp 98.3 F (36.8 C) (Oral)   Ht 5' 2.5" (1.588 m)   Wt 127 lb 8 oz (57.8 kg)   SpO2 99%   BMI 22.95 kg/m  General:  well developed, well hydrated, in no apparent distress Skin:  warm, no pallor or diaphoresis, no rashes Throat/Pharynx:  lips and gingiva without lesion; tongue and uvula midline; non-inflamed pharynx; no exudates or postnasal drainage Lungs:  clear to auscultation, breath sounds equal bilaterally, no respiratory distress, no wheezes Cardio:  RRR Abdomen:  abdomen soft, diffusely TTP; bowel sounds normal; no masses or organomegaly Psych: Appropriate judgement/insight  Assessment and Plan  Viral gastroenteritis  Vitamin D insufficiency - Plan: Vitamin D (25 hydroxy), CBC, Comprehensive metabolic panel, QuantiFERON-TB Gold Plus  Plaque psoriasis - Plan: CBC, Comprehensive metabolic panel, QuantiFERON-TB Gold Plus  Encounter for long-term (current) use of high-risk medication - Plan: CBC, Comprehensive metabolic panel, QuantiFERON-TB Gold Plus  She has hyoscyamine and Zofran at home which she will use as needed.  Okay to use with Tylenol.  This should take some time to pass.  She has having some upper  respiratory issues as well.  Could be allergies but will check for COVID here.  While she would not be interested in taking an antiviral medication, it would guide her management with her safe return to work. Avoid aggravating foods, discussed advancing diet. F/u if symptoms fail to improve, sooner if worsening. Check above labs.  Her follow-up will be based off of the results. The patient and her spouse voiced understanding and agreement to the plan.  [place], DO 07/02/22  4:42 PM

## 2022-07-03 ENCOUNTER — Other Ambulatory Visit: Payer: Self-pay | Admitting: Family Medicine

## 2022-07-03 LAB — COMPREHENSIVE METABOLIC PANEL
ALT: 12 U/L (ref 0–35)
AST: 14 U/L (ref 0–37)
Albumin: 4 g/dL (ref 3.5–5.2)
Alkaline Phosphatase: 27 U/L — ABNORMAL LOW (ref 39–117)
BUN: 11 mg/dL (ref 6–23)
CO2: 30 mEq/L (ref 19–32)
Calcium: 9.1 mg/dL (ref 8.4–10.5)
Chloride: 105 mEq/L (ref 96–112)
Creatinine, Ser: 0.82 mg/dL (ref 0.40–1.20)
GFR: 87.17 mL/min (ref >60.00)
Glucose, Bld: 116 mg/dL — ABNORMAL HIGH (ref 70–99)
Potassium: 3.9 mEq/L (ref 3.5–5.1)
Sodium: 140 mEq/L (ref 135–145)
Total Bilirubin: 0.4 mg/dL (ref 0.2–1.2)
Total Protein: 7 g/dL (ref 6.0–8.3)

## 2022-07-03 LAB — CBC
HCT: 37.3 % (ref 36.0–46.0)
Hemoglobin: 12.6 g/dL (ref 12.0–15.0)
MCHC: 33.8 g/dL (ref 30.0–36.0)
MCV: 89.7 fl (ref 78.0–100.0)
Platelets: 241 10*3/uL (ref 150.0–400.0)
RBC: 4.16 Mil/uL (ref 3.87–5.11)
RDW: 13.4 % (ref 11.5–15.5)
WBC: 8.3 10*3/uL (ref 4.0–10.5)

## 2022-07-03 LAB — VITAMIN D 25 HYDROXY (VIT D DEFICIENCY, FRACTURES): VITD: 19.06 ng/mL — ABNORMAL LOW (ref 30.00–100.00)

## 2022-07-03 MED ORDER — VITAMIN D (ERGOCALCIFEROL) 1.25 MG (50000 UNIT) PO CAPS: 50000.0000 [IU] | ORAL_CAPSULE | ORAL | 0 refills | Status: DC

## 2022-07-04 LAB — QUANTIFERON-TB GOLD PLUS
Mitogen-NIL: >10 IU/mL
NIL: 0.1 IU/mL
QuantiFERON-TB Gold Plus: NEGATIVE
TB1-NIL: 0.02 IU/mL
TB2-NIL: 0.01 IU/mL

## 2022-07-08 ENCOUNTER — Other Ambulatory Visit: Payer: Self-pay | Admitting: Family Medicine

## 2022-09-24 DIAGNOSIS — E039 Hypothyroidism, unspecified: Secondary | ICD-10-CM | POA: Diagnosis not present

## 2022-09-24 DIAGNOSIS — Z9641 Presence of insulin pump (external) (internal): Secondary | ICD-10-CM | POA: Diagnosis not present

## 2022-09-24 DIAGNOSIS — E109 Type 1 diabetes mellitus without complications: Secondary | ICD-10-CM | POA: Diagnosis not present

## 2022-10-08 ENCOUNTER — Encounter: Payer: Self-pay | Admitting: Family Medicine

## 2022-10-08 ENCOUNTER — Encounter: Payer: BC Managed Care – PPO | Admitting: Family Medicine

## 2022-10-22 ENCOUNTER — Ambulatory Visit: Payer: BC Managed Care – PPO | Admitting: Family Medicine

## 2022-10-22 ENCOUNTER — Encounter: Payer: Self-pay | Admitting: Family Medicine

## 2022-10-22 VITALS — BP 108/62 | HR 66 | Temp 98.6°F | Ht 62.0 in | Wt 128.1 lb

## 2022-10-22 DIAGNOSIS — E559 Vitamin D deficiency, unspecified: Secondary | ICD-10-CM | POA: Diagnosis not present

## 2022-10-22 NOTE — Patient Instructions (Signed)
 Give us 2-3 business days to get the results of your labs back.   Keep the diet clean and stay active.  Let us know if you need anything. 

## 2022-10-22 NOTE — Progress Notes (Signed)
Chief Complaint  Patient presents with   Follow-up    Subjective: Patient is a 42 y.o. female here for fu vit D.  Patient has a history of low vitamin D.  Starting at the end of March of this year, she took weekly prescription strength vitamin D 50,000 units.  She reports compliance and no adverse effects.  She states that treatment significantly helped with her energy levels.  She has been off of it for 2 weeks and notices her level of fatigue has increased.  Past Medical History:  Diagnosis Date   Celiac disease    GERD (gastroesophageal reflux disease)    Hypothyroidism    Iron deficiency    Psoriatic arthritis (HCC)    Type 1 diabetes mellitus (HCC)     Objective: BP 108/62 (BP Location: Left Arm, Patient Position: Sitting, Cuff Size: Normal)   Pulse 66   Temp 98.6 F (37 C) (Oral)   Ht 5\' 2"  (1.575 m)   Wt 128 lb 2 oz (58.1 kg)   SpO2 99%   BMI 23.43 kg/m  General: Awake, appears stated age Heart: RRR, no LE edema Lungs: CTAB, no rales, wheezes or rhonchi. No accessory muscle use MSK: No ttp over ant tibia b/l Psych: Age appropriate judgment and insight, normal affect and mood  Assessment and Plan: Vitamin D deficiency - Plan: VITAMIN D 25 Hydroxy (Vit-D Deficiency, Fractures)  Chronic, hopefully stable.  Check above labs.  Will likely need to resend prescription strength vitamin D 50,000 units weekly. Tetanus shot politely declined today. I will see her in 6 months for her physical or as needed. The patient voiced understanding and agreement to the plan.  Jilda Roche [place], DO 10/22/22  1:56 PM

## 2022-10-23 LAB — VITAMIN D 25 HYDROXY (VIT D DEFICIENCY, FRACTURES): VITD: 44 ng/mL (ref 30.00–100.00)

## 2022-12-13 ENCOUNTER — Other Ambulatory Visit: Payer: Self-pay | Admitting: Family Medicine

## 2022-12-30 DIAGNOSIS — L400 Psoriasis vulgaris: Secondary | ICD-10-CM | POA: Diagnosis not present

## 2022-12-30 DIAGNOSIS — Z79899 Other long term (current) drug therapy: Secondary | ICD-10-CM | POA: Diagnosis not present

## 2023-04-23 ENCOUNTER — Encounter (HOSPITAL_COMMUNITY): Payer: Self-pay | Admitting: *Deleted

## 2023-04-23 ENCOUNTER — Ambulatory Visit (INDEPENDENT_AMBULATORY_CARE_PROVIDER_SITE_OTHER): Payer: BC Managed Care – PPO

## 2023-04-23 ENCOUNTER — Encounter: Payer: Self-pay | Admitting: Family Medicine

## 2023-04-23 ENCOUNTER — Ambulatory Visit (HOSPITAL_COMMUNITY)
Admission: EM | Admit: 2023-04-23 | Discharge: 2023-04-23 | Disposition: A | Discharge status: Home / Self Care | Payer: BC Managed Care – PPO | Attending: Family Medicine | Admitting: Family Medicine

## 2023-04-23 ENCOUNTER — Other Ambulatory Visit: Payer: Self-pay

## 2023-04-23 DIAGNOSIS — J069 Acute upper respiratory infection, unspecified: Secondary | ICD-10-CM

## 2023-04-23 DIAGNOSIS — R059 Cough, unspecified: Secondary | ICD-10-CM | POA: Diagnosis not present

## 2023-04-23 DIAGNOSIS — R509 Fever, unspecified: Secondary | ICD-10-CM | POA: Diagnosis not present

## 2023-04-23 DIAGNOSIS — R051 Acute cough: Secondary | ICD-10-CM | POA: Diagnosis not present

## 2023-04-23 LAB — POC COVID19/FLU A&B COMBO
Covid Antigen, POC: NEGATIVE
Influenza A Antigen, POC: NEGATIVE
Influenza B Antigen, POC: NEGATIVE

## 2023-04-23 MED ORDER — HYDROCODONE BIT-HOMATROP MBR 5-1.5 MG/5ML PO SOLN: 5.0000 mL | Freq: Four times a day (QID) | ORAL | 0 refills | Status: DC | PRN

## 2023-04-23 NOTE — Discharge Instructions (Signed)
 Be aware, your cough medication may cause drowsiness. Please do not drive, operate heavy machinery or make important decisions while on this medication, it can cloud your judgement.

## 2023-04-23 NOTE — ED Triage Notes (Signed)
 Pt reports cough and fever that started Sun. Pt has worked with several people who have been DX with PNA. P thas taken OTC with out relief.

## 2023-04-23 NOTE — ED Provider Notes (Signed)
 [facility]   [id number] 04/23/23 Arrival Time: 1607  ASSESSMENT & PLAN:  1. Fever, unspecified fever cause   2. Acute cough    I have personally viewed and independently interpreted the imaging studies ordered this visit. CXR: no acute changes appreciated.  Discussed typical duration of likely viral illness. Results for orders placed or performed during the hospital encounter of 04/23/23  POC Covid19/Flu A&B Antigen   Collection Time: 04/23/23  5:19 PM  Result Value Ref Range   Influenza A Antigen, POC Negative Negative   Influenza B Antigen, POC Negative Negative   Covid Antigen, POC Negative Negative   OTC symptom care as needed.  New Prescriptions   HYDROCODONE  BIT-HOMATROPINE (HYCODAN) 5-1.5 MG/5ML SYRUP    Take 5 mLs by mouth every 6 (six) hours as needed for cough.     Discharge Instructions      Be aware, your cough medication may cause drowsiness. Please do not drive, operate heavy machinery or make important decisions while on this medication, it can cloud your judgement.        Reviewed expectations re: course of current medical issues. Questions answered. Outlined signs and symptoms indicating need for more acute intervention. Understanding verbalized. After Visit Summary given.   SUBJECTIVE: History from: Patient. Sarah Flynn is a 43 y.o. female. Reports: Pt reports cough and fever that started Sun. Pt has worked with several people who have been DX with PNA. P thas taken OTC with out relief. Denies: difficulty breathing. Normal PO intake without n/v/d.  OBJECTIVE:  Vitals:   04/23/23 1645  BP: (!) 156/89  Pulse: 85  Resp: 18  Temp: 99.2 F (37.3 C)  SpO2: 99%    General appearance: alert; no distress Eyes: PERRLA; EOMI; conjunctiva normal HENT: [place]; AT; with nasal congestion; throat with significant cobblestoning Neck: supple  Lungs: speaks full sentences without difficulty; unlabored; slightly coarse breath sounds  bilaterally Extremities: no edema Skin: warm and dry Neurologic: normal gait Psychological: alert and cooperative; normal mood and affect  Labs: Results for orders placed or performed during the hospital encounter of 04/23/23  POC Covid19/Flu A&B Antigen   Collection Time: 04/23/23  5:19 PM  Result Value Ref Range   Influenza A Antigen, POC Negative Negative   Influenza B Antigen, POC Negative Negative   Covid Antigen, POC Negative Negative   Labs Reviewed  POC COVID19/FLU A&B COMBO    Imaging: No results found.   Allergies  Allergen Reactions   Other     Gluten     Past Medical History:  Diagnosis Date   Celiac disease    GERD (gastroesophageal reflux disease)    Hypothyroidism    Iron deficiency    Psoriatic arthritis (HCC)    Type 1 diabetes mellitus (HCC)    Social History   Socioeconomic History   Marital status: Married    Spouse name: Not on file   Number of children: Not on file   Years of education: Not on file   Highest education level: Not on file  Occupational History   Not on file  Tobacco Use   Smoking status: Never   Smokeless tobacco: Never  Vaping Use   Vaping status: Never Used  Substance and Sexual Activity   Alcohol use: Yes    Comment: occ   Drug use: No   Sexual activity: Not on file  Other Topics Concern   Not on file  Social History Narrative   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[place]  Sister in [place]   Sister in [place] from [place]   Married    2 dogs (doberman mix and Barista)   Social Drivers of Corporate investment banker Strain: Not on file  Food Insecurity: Low Risk  (09/24/2022)   [facility]   Hunger Vital Sign    Worried About Running Out of Food in the Last Year: Never true    Ran Out of Food in the Last Year: Never true  Transportation Needs: Not on file (09/24/2022)  Physical Activity: Not on file  Stress: Not on file  Social  Connections: Not on file  Intimate Partner Violence: Not on file   Family History  Problem Relation Age of Onset   Heart attack Paternal Grandfather    Thyroid  disease Mother        hx of hypothyroid, resolved   Eczema Mother    Hypertension Father    Hyperlipidemia Father    Hearing loss Father    Psoriasis Father    Asthma Neg Hx    Allergic rhinitis Neg Hx    Urticaria Neg Hx    Immunodeficiency Neg Hx    Angioedema Neg Hx    History reviewed. No pertinent surgical history.   Afton Albright, MD 04/23/23 [phone]

## 2023-04-24 ENCOUNTER — Ambulatory Visit: Payer: BC Managed Care – PPO | Admitting: Family

## 2023-04-28 ENCOUNTER — Telehealth (INDEPENDENT_AMBULATORY_CARE_PROVIDER_SITE_OTHER): Payer: BC Managed Care – PPO | Admitting: Family Medicine

## 2023-04-28 ENCOUNTER — Telehealth: Payer: Self-pay

## 2023-04-28 DIAGNOSIS — J209 Acute bronchitis, unspecified: Secondary | ICD-10-CM

## 2023-04-28 MED ORDER — PREDNISONE 20 MG PO TABS: 40.0000 mg | ORAL_TABLET | Freq: Every day | ORAL | 0 refills | Status: AC

## 2023-04-28 MED ORDER — AZITHROMYCIN 250 MG PO TABS
ORAL_TABLET | ORAL | 0 refills | Status: DC
Start: 2023-04-28 — End: 2023-06-03

## 2023-04-28 NOTE — Telephone Encounter (Signed)
Pt was seen today.

## 2023-04-28 NOTE — Telephone Encounter (Signed)
Pt scheduled today

## 2023-04-28 NOTE — Progress Notes (Signed)
CC: [name]  Sarah Flynn here for [name] complaints. We are interacting via web portal for an electronic face-to-face visit. I verified patient's ID using 2 identifiers. Patient agreed to proceed with visit via this method. Patient is at home, I am at office. Patient and I are present for visit.   Duration: 1 week  Associated symptoms: Fever (100 F), sinus congestion, sinus pain, rhinorrhea, sore throat, wheezing, shortness of breath, myalgia, and coughing Denies: itchy watery eyes, ear pain, and ear drainage Treatment to date: Hycodan, Tylenol, Nyquil, Sudafed Sick contacts: Yes- coworkers  Past Medical History:  Diagnosis Date   Celiac disease    GERD (gastroesophageal reflux disease)    Hypothyroidism    Iron deficiency    Psoriatic arthritis (HCC)    Type 1 diabetes mellitus (HCC)     Objective No conversational dyspnea Age appropriate judgment and insight Nml affect and mood  Acute wheezy bronchitis - Plan: azithromycin (ZITHROMAX) 250 MG tablet, predniSONE (DELTASONE) 20 MG tablet  Given continued fevers in the 100s, will treat for bacterial infection with a Z-Pak.  Prednisone for potential wheezing.  Send message if no improvement in the next few days.  Continue to push fluids, practice good hand hygiene, cover mouth when coughing. F/u prn. If starting to experience fevers, shaking, or worsening shortness of breath, seek immediate care. Pt voiced understanding and agreement to the plan.  Jilda Roche [place], DO 04/28/23 9:37 AM

## 2023-04-29 ENCOUNTER — Encounter: Payer: BC Managed Care – PPO | Admitting: Family Medicine

## 2023-05-22 DIAGNOSIS — E039 Hypothyroidism, unspecified: Secondary | ICD-10-CM | POA: Diagnosis not present

## 2023-05-22 DIAGNOSIS — Z9641 Presence of insulin pump (external) (internal): Secondary | ICD-10-CM | POA: Diagnosis not present

## 2023-05-22 DIAGNOSIS — Z794 Long term (current) use of insulin: Secondary | ICD-10-CM | POA: Diagnosis not present

## 2023-05-22 DIAGNOSIS — E109 Type 1 diabetes mellitus without complications: Secondary | ICD-10-CM | POA: Diagnosis not present

## 2023-05-23 ENCOUNTER — Ambulatory Visit: Payer: BC Managed Care – PPO | Admitting: Family Medicine

## 2023-06-02 ENCOUNTER — Encounter: Payer: Self-pay | Admitting: Family Medicine

## 2023-06-02 ENCOUNTER — Ambulatory Visit: Payer: BC Managed Care – PPO | Admitting: Family Medicine

## 2023-06-02 ENCOUNTER — Ambulatory Visit: Payer: Self-pay | Admitting: Family Medicine

## 2023-06-02 ENCOUNTER — Telehealth: Payer: BC Managed Care – PPO

## 2023-06-02 NOTE — Telephone Encounter (Signed)
  Chief Complaint: flu like symptoms Symptoms: fever, weakness, nausea, body aches, cough, runny nose, sore throat Frequency: since Saturday morning. Pertinent Negatives: Patient denies SOB, wheezing, difficulty breathing. Disposition: [] ED /[] Urgent Care (no appt availability in office) / [x] Appointment(In office/virtual)/ []  [facility]/ [] Home Care/ [] Refused Recommended Disposition /[] [facility]/ []  Follow-up with PCP Additional Notes: Patient states she has been taking Theraflu x 1, muccinex, Tylenol, Dayquil and Nyquil OTC. Patient took home COVID and flu test yesterday that was negative. Patient scheduled for acute virtual visit, cancelled urgent care virtual visit.  Reason for Disposition  [1] Fever > 100.0 F (37.8 C) AND [2] diabetes mellitus or weak immune system (e.g., HIV positive, cancer chemo, splenectomy, organ transplant, chronic steroids)  Answer Assessment - Initial Assessment Questions 1. WORST SYMPTOM: "What is your worst symptom?" (e.g., cough, runny nose, muscle aches, headache, sore throat, fever)      Fevers, weakness, body aches  2. ONSET: "When did your flu symptoms start?"      Saturday morning.  3. COUGH: "How bad is the cough?"       Worse when lying down and states it has not been as bad today.  4. RESPIRATORY DISTRESS: "Describe your breathing."      Denies any difficulties breathing.  5. FEVER: "Do you have a fever?" If Yes, ask: "What is your temperature, how was it measured, and when did it start?"     She states fever today was 99.6 and her highest was 102.9 last night at 1030pm.  6. EXPOSURE: "Were you exposed to someone with influenza?"       Possibly, husband has same symptoms and his started first after exposure at work.  7. FLU VACCINE: "Did you get a flu shot this year?"     Denies.  8. HIGH RISK DISEASE: "Do you have any chronic medical problems?" (e.g., heart or lung disease, asthma, weak immune system, or other HIGH  RISK conditions)     Type 1 DM.  9. PREGNANCY: "Is there any chance you are pregnant?" "When was your last menstrual period?"     05/05/23, denies chance of pregnancy.  10. OTHER SYMPTOMS: "Do you have any other symptoms?"  (e.g., runny nose, muscle aches, headache, sore throat)       Lower back pain, headache, runny nose, sore throat.  Protocols used: Influenza (Flu) - [facility]

## 2023-06-03 ENCOUNTER — Telehealth: Payer: BC Managed Care – PPO | Admitting: Family Medicine

## 2023-06-03 ENCOUNTER — Encounter: Payer: Self-pay | Admitting: Family Medicine

## 2023-06-03 DIAGNOSIS — R6889 Other general symptoms and signs: Secondary | ICD-10-CM | POA: Diagnosis not present

## 2023-06-03 MED ORDER — FLUCONAZOLE 150 MG PO TABS: ORAL_TABLET | ORAL | 0 refills | Status: AC

## 2023-06-03 MED ORDER — HYDROCODONE BIT-HOMATROP MBR 5-1.5 MG/5ML PO SOLN: 5.0000 mL | Freq: Four times a day (QID) | ORAL | 0 refills | Status: DC | PRN

## 2023-06-03 MED ORDER — DOXYCYCLINE HYCLATE 100 MG PO TABS: 100.0000 mg | ORAL_TABLET | Freq: Two times a day (BID) | ORAL | 0 refills | Status: AC

## 2023-06-03 NOTE — Progress Notes (Signed)
 Chief Complaint  Patient presents with   Sore Throat    Sore throat and body aches    [name] here for [name] complaints. We are interacting via web portal for an electronic face-to-face visit. I verified patient's ID using 2 identifiers. Patient agreed to proceed with visit via this method. Patient is at home, I am at office. Patient and I are present for visit.   Duration: 3 days  Associated symptoms: Fever (102 F max), sinus congestion, rhinorrhea, sore throat, myalgia, and coughing, N/V/D Denies: sinus pain, ear pain, ear drainage, wheezing, and shortness of breath Treatment to date: Zofran, Tylenol, Dayquil, Nyquil Sick contacts: Yes; spouse and spouse's boss Tested neg for Covid/Flu at home x 2.   Past Medical History:  Diagnosis Date   Celiac disease    GERD (gastroesophageal reflux disease)    Hypothyroidism    Iron deficiency    Psoriatic arthritis (HCC)    Type 1 diabetes mellitus (HCC)    Objective No conversational dyspnea Age appropriate judgment and insight Nml affect and mood  Flu-like symptoms - Plan: HYDROcodone bit-homatropine (HYCODAN) 5-1.5 MG/5ML syrup, fluconazole (DIFLUCAN) 150 MG tablet  Tested neg for covid and flu x 2. Will cover for PNA/bacterial infections given fever. . Continue to push fluids, practice good hand hygiene, cover mouth when coughing. If she isn't improving, she will reach out and we will ck a CXR.  F/u prn. If starting to experience worsening s/s's, shaking, or shortness of breath, seek immediate care. Pt voiced understanding and agreement to the plan.  Sarah Roche [place], DO 06/03/23 8:29 AM

## 2023-06-04 ENCOUNTER — Encounter: Payer: Self-pay | Admitting: Family Medicine

## 2023-06-04 ENCOUNTER — Other Ambulatory Visit: Payer: Self-pay | Admitting: Family Medicine

## 2023-06-04 MED ORDER — PROMETHAZINE HCL 12.5 MG PO TABS: 12.5000 mg | ORAL_TABLET | Freq: Four times a day (QID) | ORAL | 0 refills | Status: DC | PRN

## 2023-06-13 ENCOUNTER — Encounter: Payer: Self-pay | Admitting: Family Medicine

## 2023-06-13 ENCOUNTER — Other Ambulatory Visit: Payer: Self-pay | Admitting: Family Medicine

## 2023-06-13 ENCOUNTER — Ambulatory Visit: Payer: BC Managed Care – PPO | Admitting: Family Medicine

## 2023-06-13 VITALS — BP 120/72 | HR 85 | Temp 98.5°F | Resp 20 | Ht 62.0 in | Wt 123.8 lb

## 2023-06-13 DIAGNOSIS — E039 Hypothyroidism, unspecified: Secondary | ICD-10-CM

## 2023-06-13 DIAGNOSIS — Z0000 Encounter for general adult medical examination without abnormal findings: Secondary | ICD-10-CM

## 2023-06-13 DIAGNOSIS — E559 Vitamin D deficiency, unspecified: Secondary | ICD-10-CM | POA: Diagnosis not present

## 2023-06-13 DIAGNOSIS — Z23 Encounter for immunization: Secondary | ICD-10-CM

## 2023-06-13 DIAGNOSIS — E1065 Type 1 diabetes mellitus with hyperglycemia: Secondary | ICD-10-CM | POA: Diagnosis not present

## 2023-06-13 LAB — LIPID PANEL
Cholesterol: 131 mg/dL (ref 0–200)
HDL: 52.3 mg/dL (ref >39.00)
LDL Cholesterol: 60 mg/dL (ref 0–99)
NonHDL: 78.2
Total CHOL/HDL Ratio: 2
Triglycerides: 92 mg/dL (ref 0.0–149.0)
VLDL: 18.4 mg/dL (ref 0.0–40.0)

## 2023-06-13 LAB — CBC WITH DIFFERENTIAL/PLATELET
Basophils Absolute: 0 10*3/uL (ref 0.0–0.1)
Basophils Relative: 0.5 % (ref 0.0–3.0)
Eosinophils Absolute: 0.1 10*3/uL (ref 0.0–0.7)
Eosinophils Relative: 1.1 % (ref 0.0–5.0)
HCT: 39.9 % (ref 36.0–46.0)
Hemoglobin: 13.4 g/dL (ref 12.0–15.0)
Lymphocytes Relative: 31.2 % (ref 12.0–46.0)
Lymphs Abs: 2.1 10*3/uL (ref 0.7–4.0)
MCHC: 33.6 g/dL (ref 30.0–36.0)
MCV: 90.4 fl (ref 78.0–100.0)
Monocytes Absolute: 0.9 10*3/uL (ref 0.1–1.0)
Monocytes Relative: 13.4 % — ABNORMAL HIGH (ref 3.0–12.0)
Neutro Abs: 3.6 10*3/uL (ref 1.4–7.7)
Neutrophils Relative %: 53.8 % (ref 43.0–77.0)
Platelets: 390 10*3/uL (ref 150.0–400.0)
RBC: 4.41 Mil/uL (ref 3.87–5.11)
RDW: 13.4 % (ref 11.5–15.5)
WBC: 6.7 10*3/uL (ref 4.0–10.5)

## 2023-06-13 LAB — COMPREHENSIVE METABOLIC PANEL
ALT: 23 U/L (ref 0–35)
AST: 15 U/L (ref 0–37)
Albumin: 3.9 g/dL (ref 3.5–5.2)
Alkaline Phosphatase: 31 U/L — ABNORMAL LOW (ref 39–117)
BUN: 14 mg/dL (ref 6–23)
CO2: 27 meq/L (ref 19–32)
Calcium: 9.2 mg/dL (ref 8.4–10.5)
Chloride: 103 meq/L (ref 96–112)
Creatinine, Ser: 0.71 mg/dL (ref 0.40–1.20)
GFR: 102.93 mL/min (ref >60.00)
Glucose, Bld: 157 mg/dL — ABNORMAL HIGH (ref 70–99)
Potassium: 4 meq/L (ref 3.5–5.1)
Sodium: 137 meq/L (ref 135–145)
Total Bilirubin: 0.5 mg/dL (ref 0.2–1.2)
Total Protein: 6.9 g/dL (ref 6.0–8.3)

## 2023-06-13 LAB — MICROALBUMIN / CREATININE URINE RATIO
Creatinine,U: 100.4 mg/dL
Microalb Creat Ratio: 7 mg/g (ref 0.0–30.0)
Microalb, Ur: <0.7 mg/dL (ref 0.0–1.9)

## 2023-06-13 LAB — TSH: TSH: 2.21 u[IU]/mL (ref 0.35–5.50)

## 2023-06-13 LAB — VITAMIN D 25 HYDROXY (VIT D DEFICIENCY, FRACTURES): VITD: 18.21 ng/mL — ABNORMAL LOW (ref 30.00–100.00)

## 2023-06-13 LAB — T4, FREE: Free T4: 0.88 ng/dL (ref 0.60–1.60)

## 2023-06-13 MED ORDER — VITAMIN D (ERGOCALCIFEROL) 1.25 MG (50000 UNIT) PO CAPS: 50000.0000 [IU] | ORAL_CAPSULE | ORAL | 0 refills | Status: DC

## 2023-06-13 MED ORDER — ONDANSETRON 4 MG PO TBDP: 4.0000 mg | ORAL_TABLET | Freq: Three times a day (TID) | ORAL | 0 refills | Status: AC | PRN

## 2023-06-13 NOTE — Patient Instructions (Addendum)
 Give [place] 2-3 business days to get the results of your labs back.   Keep the diet clean and stay active.  Please consider adding some weight resistance exercise to your routine. Consider yoga as well.   Call Center for [facility] at [phone] for an appointment.  They are located at [address], [place], [place], [address] (right across the hall from our office).  Please get me a copy of your advanced directive form at your convenience.   Let us know if you need anything.

## 2023-06-13 NOTE — Addendum Note (Signed)
 Addended by: Maximino Sarin on: 06/13/2023 08:46 AM   Modules accepted: Orders

## 2023-06-13 NOTE — Progress Notes (Signed)
 Chief Complaint  Patient presents with   Annual Exam    Patient presents today for a physical exam     Well Woman Sarah Flynn is here for a complete physical.   Her last physical was >1 year ago.  Current diet: in general, a "healthy" diet. Current exercise: walking. Weight is stable and she denies fatigue out of ordinary. Seatbelt? Yes Advanced directive? No  Health Maintenance Pap/HPV- Due Mammogram- Yes Tetanus- No Hep C screening- Yes HIV screening- Yes  Past Medical History:  Diagnosis Date   Celiac disease    GERD (gastroesophageal reflux disease)    Hypothyroidism    Iron deficiency    Psoriatic arthritis (HCC)    Type 1 diabetes mellitus (HCC)      History reviewed. No pertinent surgical history.  Medications  Current Outpatient Medications on File Prior to Visit  Medication Sig Dispense Refill   clindamycin (CLEOCIN T) 1 % external solution      clobetasol (TEMOVATE) 0.05 % external solution Apply 1 application topically 2 (two) times daily.     Continuous Blood Gluc Sensor (DEXCOM G6 SENSOR) MISC Inject 1 sensor to the skin every 10 days for continuous glucose monitoring.     diphenoxylate-atropine (LOMOTIL) 2.5-0.025 MG tablet Take 1 tablet by mouth 4 (four) times daily.     doxycycline (VIBRAMYCIN) 50 MG capsule Take 50 mg by mouth 2 (two) times daily.     fluconazole (DIFLUCAN) 150 MG tablet Take 1 tab, repeat in 72 hours if no improvement. 2 tablet 0   gabapentin (NEURONTIN) 100 MG capsule Take 1 capsule (100 mg total) by mouth at bedtime. 30 capsule 0   Hyoscyamine Sulfate SL 0.125 MG SUBL Take by mouth.     insulin aspart (NOVOLOG) 100 UNIT/ML injection USE AS DIRECTED IN INSULIN PUMP (MDD: 100 U/DAILY)     Insulin Disposable Pump (OMNIPOD DASH 5 PACK PODS) MISC USE 1 POD EVERY 3 DAYS AS DIRECTED     STELARA 45 MG/0.5ML SOSY injection      valACYclovir (VALTREX) 1000 MG tablet Take 1 tablet (1,000 mg total) by mouth 2 (two) times daily. 20 tablet 0     Allergies Allergies  Allergen Reactions   Other     Gluten     Review of Systems: Constitutional:  no unexpected weight changes Eye:  no recent significant change in vision Ear/Nose/Mouth/Throat:  Ears:  no recent change in hearing Nose/Mouth/Throat:  no complaints of nasal congestion, no sore throat Cardiovascular: no chest pain Respiratory:  no shortness of breath Gastrointestinal:  no abdominal pain, no change in bowel habits GU:  Female: negative for dysuria or pelvic pain Musculoskeletal/Extremities: +LL back pain Integumentary (Skin/Breast):  no new abnormal skin lesions reported Neurologic:  no headaches Endocrine:  denies fatigue Hematologic/Lymphatic:  No areas of easy bleeding  Exam BP 120/72   Pulse 85   Temp 98.5 F (36.9 C)   Resp 20   Ht 5\' 2"  (1.575 m)   Wt 123 lb 12.8 oz (56.2 kg)   LMP 05/31/2023 (Approximate)   SpO2 99%   BMI 22.64 kg/m  General:  well developed, well nourished, in no apparent distress Skin:  no significant moles, warts, or growths Head:  no masses, lesions, or tenderness Eyes:  pupils equal and round, sclera anicteric without injection Ears:  canals without lesions, TMs shiny without retraction, no obvious effusion, no erythema Nose:  nares patent, mucosa normal, and no drainage Throat/Pharynx:  lips and gingiva without lesion; tongue  and uvula midline; non-inflamed pharynx; no exudates or postnasal drainage Neck: neck supple without adenopathy, thyromegaly, or masses Lungs:  clear to auscultation, breath sounds equal bilaterally, no respiratory distress Cardio:  regular rate and rhythm, no LE edema Abdomen:  abdomen soft, nontender; bowel sounds normal; no masses or organomegaly Genital: Defer to GYN Musculoskeletal:  symmetrical muscle groups noted without atrophy or deformity Extremities:  no clubbing, cyanosis, or edema, no deformities, no skin discoloration Neuro:  gait normal; deep tendon reflexes normal and  symmetric Psych: well oriented with normal range of affect and appropriate judgment/insight  Assessment and Plan  Well adult exam - Plan: Comp Met (CMET), CBC with Differential/Platelet, Lipid panel  Hypothyroidism, unspecified type - Plan: T4, free, TSH  Vitamin D insufficiency - Plan: VITAMIN D 25 Hydroxy (Vit-D Deficiency, Fractures)  Type 1 diabetes mellitus with hyperglycemia (HCC) - Plan: Urine Microalbumin w/creat. ratio   Well 43 y.o. female. Counseled on diet and exercise. GYN info provided. Encouraged to reach out to her previous provider but she may be retired.  Advanced directive form provided today.  Tdap today. Also PCV20.  Other orders as above. Follow up in 6 mo. The patient voiced understanding and agreement to the plan.  Jilda Roche [place], DO 06/13/23 8:35 AM

## 2023-08-04 DIAGNOSIS — L400 Psoriasis vulgaris: Secondary | ICD-10-CM | POA: Diagnosis not present

## 2023-08-04 DIAGNOSIS — Z79899 Other long term (current) drug therapy: Secondary | ICD-10-CM | POA: Diagnosis not present

## 2023-08-05 ENCOUNTER — Encounter: Payer: Self-pay | Admitting: Family Medicine

## 2023-08-11 ENCOUNTER — Emergency Department (HOSPITAL_BASED_OUTPATIENT_CLINIC_OR_DEPARTMENT_OTHER)
Admission: EM | Admit: 2023-08-11 | Discharge: 2023-08-11 | Disposition: A | Discharge status: Home / Self Care | Attending: Emergency Medicine | Admitting: Emergency Medicine

## 2023-08-11 ENCOUNTER — Emergency Department (HOSPITAL_BASED_OUTPATIENT_CLINIC_OR_DEPARTMENT_OTHER)

## 2023-08-11 ENCOUNTER — Encounter (HOSPITAL_BASED_OUTPATIENT_CLINIC_OR_DEPARTMENT_OTHER): Payer: Self-pay | Admitting: Emergency Medicine

## 2023-08-11 ENCOUNTER — Other Ambulatory Visit: Payer: Self-pay

## 2023-08-11 DIAGNOSIS — S61452A Open bite of left hand, initial encounter: Secondary | ICD-10-CM | POA: Diagnosis not present

## 2023-08-11 DIAGNOSIS — S60419A Abrasion of unspecified finger, initial encounter: Secondary | ICD-10-CM | POA: Diagnosis not present

## 2023-08-11 DIAGNOSIS — S61551A Open bite of right wrist, initial encounter: Secondary | ICD-10-CM | POA: Diagnosis not present

## 2023-08-11 DIAGNOSIS — Z794 Long term (current) use of insulin: Secondary | ICD-10-CM | POA: Insufficient documentation

## 2023-08-11 DIAGNOSIS — W540XXA Bitten by dog, initial encounter: Secondary | ICD-10-CM | POA: Insufficient documentation

## 2023-08-11 DIAGNOSIS — S61511A Laceration without foreign body of right wrist, initial encounter: Secondary | ICD-10-CM | POA: Diagnosis not present

## 2023-08-11 DIAGNOSIS — E109 Type 1 diabetes mellitus without complications: Secondary | ICD-10-CM | POA: Insufficient documentation

## 2023-08-11 DIAGNOSIS — Y93H2 Activity, gardening and landscaping: Secondary | ICD-10-CM | POA: Diagnosis not present

## 2023-08-11 DIAGNOSIS — E039 Hypothyroidism, unspecified: Secondary | ICD-10-CM | POA: Diagnosis not present

## 2023-08-11 MED ORDER — AMOXICILLIN-POT CLAVULANATE 875-125 MG PO TABS
1.0000 | ORAL_TABLET | Freq: Once | ORAL | Status: AC
  Administered 2023-08-11: 1 via ORAL
  Filled 2023-08-11: qty 1

## 2023-08-11 MED ORDER — AMOXICILLIN-POT CLAVULANATE 875-125 MG PO TABS: 1.0000 | ORAL_TABLET | Freq: Two times a day (BID) | ORAL | 0 refills | Status: DC

## 2023-08-11 NOTE — Discharge Instructions (Signed)
 Thank you for coming to [facility] Emergency Department. Please keep the wounds clean and dry. Please take augmentin  one tablet twice per day for 7 days. Please follow up with your primary care provider within 1 week.   Do not hesitate to return to the ED or call 911 if you experience: -Worsening symptoms -Signs of infection including redness, swelling, increased pain -Lightheadedness, passing out -Fevers/chills -Anything else that concerns you

## 2023-08-11 NOTE — ED Provider Notes (Signed)
 [facility] Provider Note   CSN: [id number] Arrival date & time: 08/11/23  1932     History  Chief Complaint  Patient presents with   Animal Bite    Sarah Flynn is a 43 y.o. female with PMH as listed below who presents with dog bites. pt was mowing lawn, her 2 dogs got into a fight which she then broke up appx 1045 today.  Reports bite to R wrist, fingers on both hands. Both dogs UTD on rabies vacc. Pt UTD on tetanus (appx 1 month ago). Denies blood thinners. Pt reports animal control has been notified prior to arrival. Denies N/T.    Past Medical History:  Diagnosis Date   Celiac disease    GERD (gastroesophageal reflux disease)    Hypothyroidism    Iron deficiency    Psoriatic arthritis (HCC)    Type 1 diabetes mellitus (HCC)        Home Medications Prior to Admission medications   Medication Sig Start Date End Date Taking? Authorizing Provider  amoxicillin -clavulanate (AUGMENTIN ) 875-125 MG tablet Take 1 tablet by mouth every 12 (twelve) hours. 08/11/23  Yes Merdis Stalling, MD  clindamycin (CLEOCIN T) 1 % external solution  11/26/18   [provider]  clobetasol (TEMOVATE) 0.05 % external solution Apply 1 application topically 2 (two) times daily.    [provider]  Continuous Blood Gluc Sensor (DEXCOM G6 SENSOR) MISC Inject 1 sensor to the skin every 10 days for continuous glucose monitoring. 08/26/19   [provider]  diphenoxylate-atropine (LOMOTIL) 2.5-0.025 MG tablet Take 1 tablet by mouth 4 (four) times daily. 05/10/21   [provider]  doxycycline  (VIBRAMYCIN ) 50 MG capsule Take 50 mg by mouth 2 (two) times daily.    [provider]  fluconazole  (DIFLUCAN ) 150 MG tablet Take 1 tab, repeat in 72 hours if no improvement. 06/03/23   Jobe Mulder, DO  gabapentin  (NEURONTIN ) 100 MG capsule Take 1 capsule (100 mg total) by mouth at bedtime. 01/01/19   Jobe Mulder, DO  Hyoscyamine Sulfate SL 0.125 MG SUBL Take by mouth. 09/24/19   [provider]  insulin  aspart (NOVOLOG ) 100 UNIT/ML injection USE AS DIRECTED IN INSULIN  PUMP (MDD: 100 U/DAILY) 08/26/19   [provider]  Insulin  Disposable Pump (OMNIPOD DASH 5 PACK PODS) MISC USE 1 POD EVERY 3 DAYS AS DIRECTED 07/19/19   [provider]  ondansetron  (ZOFRAN -ODT) 4 MG disintegrating tablet Take 1 tablet (4 mg total) by mouth every 8 (eight) hours as needed for nausea or vomiting. 06/13/23   Jobe Mulder, DO  STELARA  45 MG/0.5ML SOSY injection  02/18/19   [provider]  valACYclovir  (VALTREX ) 1000 MG tablet Take 1 tablet (1,000 mg total) by mouth 2 (two) times daily. 05/19/20   Saguier, Gaylin Ke, PA-C  Vitamin D , Ergocalciferol , (DRISDOL ) 1.25 MG (50000 UNIT) CAPS capsule Take 1 capsule (50,000 Units total) by mouth every 7 (seven) days. 06/13/23   Jobe Mulder, DO      Allergies    Other    Review of Systems   Review of Systems A 10 point review of systems was performed and is negative unless otherwise reported in HPI.  Physical Exam Updated Vital Signs BP (!) 141/103   Pulse 84   Temp 97.7 F (36.5 C)   Resp 19   Ht 5\' 2"  (1.575 m)   Wt 57.6 kg   LMP 08/04/2023 (Approximate)   SpO2  99%   BMI 23.23 kg/m  Physical Exam General: Normal appearing female, lying in bed.  HEENT: NCAT, Sclera anicteric, MMM, trachea midline.  Cardiology: RRR Resp: Normal respiratory rate and effort.  Abd: Soft, non-tender, non-distended. No rebound tenderness or guarding.  GU: Deferred. MSK: 1 cm laceration to the anterior right wrist with no visible tendons, vasculature, retained teeth.  1 cm laceration to the posterior right wrist with no visible tendons, vasculature or retained teeth.  NVI in the right hand.  Some superficial scratches to the right and left fingers.  All injuries hemostatic.  Full range of motion of bilateral hands and wrists.  Compartments  soft.  No bony deformities. Skin: warm, dry.  Neuro: A&Ox4, CNs II-XII grossly intact. MAEs. Sensation grossly intact.  Psych: Normal mood and affect.   ED Results / Procedures / Treatments   Labs (all labs ordered are listed, but only abnormal results are displayed) Labs Reviewed - No data to display  EKG None  Radiology DG Wrist Complete Right Result Date: 08/11/2023 CLINICAL DATA:  Dog bite. EXAM: RIGHT WRIST - COMPLETE 4 VIEW COMPARISON:  None Available. FINDINGS: There is no evidence of fracture or dislocation. There is no evidence of arthropathy or other focal bone abnormality. Soft tissues are unremarkable. No radiopaque foreign bodies. IMPRESSION: Negative. Electronically Signed   By: Sydell Eva M.D.   On: 08/11/2023 20:59    Procedures Procedures    Medications Ordered in ED Medications  amoxicillin -clavulanate (AUGMENTIN ) 875-125 MG per tablet 1 tablet (1 tablet Oral Given 08/11/23 2017)    ED Course/ Medical Decision Making/ A&P                          Medical Decision Making Amount and/or Complexity of Data Reviewed Radiology: ordered.  Risk Prescription drug management.    This patient presents to the ED for concern of dog bite, this involves an extensive number of treatment options, and is a complaint that carries with it a high risk of complications and morbidity.  I considered the following differential and admission for this acute, potentially life threatening condition. Pt HDS and overall well-appearing.  MDM:    Tetanus UTD. both dogs are patient's dogs and have been vaccinated for rabies.  XRs don't show any fractures, dislocations, or retained teeth.  Wounds copiously irrigated w/ saline/iodine solution and dressed with sterile dressing. Will prescribe augmentin .  Advised Tylenol  ibuprofen  for pain control.    Imaging Studies ordered: I ordered imaging studies including right wrist x-ray I independently visualized and interpreted imaging. I  agree with the radiologist interpretation  Additional history obtained from chart review.    Reevaluation: After the interventions noted above, I reevaluated the patient and found that they have :improved  Social Determinants of Health: Lives independently  Disposition:  DC w/ discharge instructions/return precautions. All questions answered to patient's satisfaction.    Co morbidities that complicate the patient evaluation  Past Medical History:  Diagnosis Date   Celiac disease    GERD (gastroesophageal reflux disease)    Hypothyroidism    Iron deficiency    Psoriatic arthritis (HCC)    Type 1 diabetes mellitus (HCC)      Medicines Meds ordered this encounter  Medications   amoxicillin -clavulanate (AUGMENTIN ) 875-125 MG per tablet 1 tablet   amoxicillin -clavulanate (AUGMENTIN ) 875-125 MG tablet    Sig: Take 1 tablet by mouth every 12 (twelve) hours.    Dispense:  14 tablet  Refill:  0    I have reviewed the patients home medicines and have made adjustments as needed  Problem List / ED Course: Problem List Items Addressed This Visit   None Visit Diagnoses       Dog bite, initial encounter    -  Primary                   This note was created using dictation software, which may contain spelling or grammatical errors.    Merdis Stalling, MD 08/11/23 [phone]

## 2023-08-11 NOTE — ED Triage Notes (Addendum)
 Pt POV steady gait- pt was mowing lawn, her 2 dogs got into a fight which she then broke up appx 1045 today.    Reports bite to R wrist, fingers on both hands. Pts wounds wrapped in curlex prior to arrival.  Both dogs UTD on rabies vacc. Pt UTD on tetanus (appx 1 month ago). Denies blood thinners.  Pt reports animal control has been notified prior to arrival.   Took tylenol  appx 1230.

## 2023-08-11 NOTE — ED Notes (Signed)

## 2023-08-22 ENCOUNTER — Inpatient Hospital Stay: Admitting: Family Medicine

## 2023-08-22 ENCOUNTER — Ambulatory Visit: Admitting: Family Medicine

## 2023-08-25 ENCOUNTER — Encounter: Payer: Self-pay | Admitting: Family Medicine

## 2023-08-25 ENCOUNTER — Ambulatory Visit: Admitting: Family Medicine

## 2023-08-25 VITALS — BP 126/76 | HR 76 | Temp 98.0°F | Resp 16 | Ht 62.0 in | Wt 129.0 lb

## 2023-08-25 DIAGNOSIS — W540XXA Bitten by dog, initial encounter: Secondary | ICD-10-CM | POA: Diagnosis not present

## 2023-08-25 DIAGNOSIS — S61252A Open bite of right middle finger without damage to nail, initial encounter: Secondary | ICD-10-CM

## 2023-08-25 MED ORDER — LEVOCETIRIZINE DIHYDROCHLORIDE 5 MG PO TABS: 5.0000 mg | ORAL_TABLET | Freq: Every evening | ORAL | 2 refills | Status: AC

## 2023-08-25 MED ORDER — MELOXICAM 7.5 MG PO TABS: 7.5000 mg | ORAL_TABLET | Freq: Every day | ORAL | 0 refills | Status: DC

## 2023-08-25 NOTE — Patient Instructions (Addendum)
 Claritin (loratadine), Allegra (fexofenadine), Zyrtec (cetirizine) which is also equivalent to Xyzal  (levocetirizine); these are listed in order from weakest to strongest. Generic, and therefore cheaper, options are in the parentheses.   Flonase  (fluticasone ); nasal spray that is over the counter. 2 sprays each nostril, once daily. Aim towards the same side eye when you spray.  There are available OTC, and the generic versions, which may be cheaper, are in parentheses. Show this to a pharmacist if you have trouble finding any of these items.  Send me a message if the pain does not continue to improve.   Let us  know if you need anything.

## 2023-08-25 NOTE — Progress Notes (Signed)
 Chief Complaint  Patient presents with   Follow-up    Follow Up ER    Sarah Flynn is a 43 y.o. female here for a skin complaint.  Duration: 2 weeks Location: R middle finger Pruritic? No Painful? Yes Drainage? No Her pitbull bit her 2 weeks ago.  She completed antibiotics.  No redness. Other associated symptoms: Denies fevers.   Therapies tried thus far: Tylenol , sparse ibuprofen  usage  Past Medical History:  Diagnosis Date   Celiac disease    GERD (gastroesophageal reflux disease)    Hypothyroidism    Iron deficiency    Psoriatic arthritis (HCC)    Type 1 diabetes mellitus (HCC)     BP 126/76 (BP Location: Left Arm, Patient Position: Sitting)   Pulse 76   Temp 98 F (36.7 C) (Oral)   Resp 16   Ht 5\' 2"  (1.575 m)   Wt 129 lb (58.5 kg)   LMP 08/04/2023 (Approximate)   SpO2 98%   BMI 23.59 kg/m  Gen: awake, alert, appearing stated age Lungs: No accessory muscle use Skin: Dome-shaped and raised lesion on the dorsum of her distal third digit on the right hand with a central excoriation, laterally over the distal phalanx there is an excoriated area with surrounding erythema.  They are both tender to palpation.  Both are pinkish in color.. No drainage, erythema, fluctuance. Psych: Age appropriate judgment and insight  Dog bite, initial encounter - Plan: meloxicam  (MOBIC ) 7.5 MG tablet  She completed antibiotics, I do not see any indication for incision and drainage, and further antibiotics, or further intervention at this time.  Ice, Tylenol , add meloxicam  7.5 mg daily.  Send message if not significantly improving in the coming weeks. F/u prn. The patient voiced understanding and agreement to the plan.  Shellie Dials Kimber[place], DO 08/25/23 8:34 AM

## 2023-09-08 ENCOUNTER — Other Ambulatory Visit: Payer: Self-pay

## 2023-09-08 ENCOUNTER — Other Ambulatory Visit (INDEPENDENT_AMBULATORY_CARE_PROVIDER_SITE_OTHER)

## 2023-09-08 DIAGNOSIS — E559 Vitamin D deficiency, unspecified: Secondary | ICD-10-CM | POA: Diagnosis not present

## 2023-09-09 ENCOUNTER — Ambulatory Visit: Payer: Self-pay | Admitting: Family Medicine

## 2023-09-09 DIAGNOSIS — L4050 Arthropathic psoriasis, unspecified: Secondary | ICD-10-CM

## 2023-09-09 LAB — VITAMIN D 25 HYDROXY (VIT D DEFICIENCY, FRACTURES): VITD: 58.59 ng/mL (ref 30.00–100.00)

## 2023-09-14 ENCOUNTER — Other Ambulatory Visit: Payer: Self-pay | Admitting: Family Medicine

## 2023-09-25 ENCOUNTER — Other Ambulatory Visit: Payer: Self-pay | Admitting: Family Medicine

## 2023-09-25 DIAGNOSIS — K900 Celiac disease: Secondary | ICD-10-CM | POA: Diagnosis not present

## 2023-09-25 DIAGNOSIS — K219 Gastro-esophageal reflux disease without esophagitis: Secondary | ICD-10-CM | POA: Diagnosis not present

## 2023-09-25 DIAGNOSIS — W540XXA Bitten by dog, initial encounter: Secondary | ICD-10-CM

## 2023-09-25 DIAGNOSIS — R197 Diarrhea, unspecified: Secondary | ICD-10-CM | POA: Diagnosis not present

## 2023-09-25 MED ORDER — GABAPENTIN 100 MG PO CAPS: 100.0000 mg | ORAL_CAPSULE | Freq: Every day | ORAL | 0 refills | Status: DC

## 2023-09-25 MED ORDER — MELOXICAM 7.5 MG PO TABS: 7.5000 mg | ORAL_TABLET | Freq: Every day | ORAL | 0 refills | Status: AC

## 2023-09-25 NOTE — Telephone Encounter (Signed)
 Copied from CRM [id number]. Topic: Clinical - Medication Refill >> Sep 25, 2023  9:53 AM Jenice Mitts wrote: Medication: gabapentin  (NEURONTIN ) 100 MG capsule meloxicam  (MOBIC ) 7.5 MG tablet  Has the patient contacted their pharmacy? Yes (Agent: If no, request that the patient contact the pharmacy for the refill. If patient does not wish to contact the pharmacy document the reason why and proceed with request.) (Agent: If yes, when and what did the pharmacy advise?)  This is the patient's preferred pharmacy:   [id number] - [place], [place] - [address] [place] [address] Phone: [phone] Fax: [phone]  Is this the correct pharmacy for this prescription? Yes If no, delete pharmacy and type the correct one.   Has the prescription been filled recently? No  Is the patient out of the medication? Yes  Has the patient been seen for an appointment in the last year OR does the patient have an upcoming appointment? Yes  Can we respond through MyChart? Yes  Agent: Please be advised that Rx refills may take up to 3 business days. We ask that you follow-up with your pharmacy.

## 2023-09-26 MED ORDER — CITALOPRAM HYDROBROMIDE 10 MG PO TABS: 10.0000 mg | ORAL_TABLET | Freq: Every day | ORAL | 3 refills | Status: AC

## 2023-09-26 NOTE — Addendum Note (Signed)
 Addended by: Gailyn Crook M on: 09/26/2023 10:13 AM   Modules accepted: Orders

## 2023-10-03 ENCOUNTER — Telehealth (HOSPITAL_BASED_OUTPATIENT_CLINIC_OR_DEPARTMENT_OTHER): Payer: Self-pay

## 2023-10-20 DIAGNOSIS — K900 Celiac disease: Secondary | ICD-10-CM | POA: Diagnosis not present

## 2023-10-23 ENCOUNTER — Ambulatory Visit (HOSPITAL_BASED_OUTPATIENT_CLINIC_OR_DEPARTMENT_OTHER)
Admission: RE | Admit: 2023-10-23 | Discharge: 2023-10-23 | Disposition: A | Discharge status: Home / Self Care | Source: Ambulatory Visit | Attending: Family Medicine | Admitting: Family Medicine

## 2023-10-23 ENCOUNTER — Ambulatory Visit: Payer: Self-pay | Admitting: Family Medicine

## 2023-10-23 DIAGNOSIS — R1013 Epigastric pain: Secondary | ICD-10-CM | POA: Diagnosis not present

## 2023-10-23 DIAGNOSIS — L4050 Arthropathic psoriasis, unspecified: Secondary | ICD-10-CM | POA: Insufficient documentation

## 2023-10-23 DIAGNOSIS — M545 Low back pain, unspecified: Secondary | ICD-10-CM | POA: Diagnosis not present

## 2023-10-23 DIAGNOSIS — Z13820 Encounter for screening for osteoporosis: Secondary | ICD-10-CM | POA: Diagnosis not present

## 2023-10-30 ENCOUNTER — Encounter: Payer: Self-pay | Admitting: Family Medicine

## 2023-10-30 DIAGNOSIS — D1803 Hemangioma of intra-abdominal structures: Secondary | ICD-10-CM | POA: Diagnosis not present

## 2023-10-30 DIAGNOSIS — Z794 Long term (current) use of insulin: Secondary | ICD-10-CM | POA: Diagnosis not present

## 2023-10-30 DIAGNOSIS — Z9641 Presence of insulin pump (external) (internal): Secondary | ICD-10-CM | POA: Diagnosis not present

## 2023-10-30 DIAGNOSIS — R1013 Epigastric pain: Secondary | ICD-10-CM | POA: Diagnosis not present

## 2023-10-30 DIAGNOSIS — E039 Hypothyroidism, unspecified: Secondary | ICD-10-CM | POA: Diagnosis not present

## 2023-10-30 DIAGNOSIS — E109 Type 1 diabetes mellitus without complications: Secondary | ICD-10-CM | POA: Diagnosis not present

## 2023-10-30 DIAGNOSIS — Z1231 Encounter for screening mammogram for malignant neoplasm of breast: Secondary | ICD-10-CM | POA: Diagnosis not present

## 2023-10-30 DIAGNOSIS — L851 Acquired keratosis [keratoderma] palmaris et plantaris: Secondary | ICD-10-CM | POA: Diagnosis not present

## 2023-10-30 LAB — HM MAMMOGRAPHY

## 2024-02-18 DIAGNOSIS — H52223 Regular astigmatism, bilateral: Secondary | ICD-10-CM | POA: Diagnosis not present

## 2024-02-18 DIAGNOSIS — H5203 Hypermetropia, bilateral: Secondary | ICD-10-CM | POA: Diagnosis not present

## 2024-02-18 DIAGNOSIS — H524 Presbyopia: Secondary | ICD-10-CM | POA: Diagnosis not present

## 2024-02-22 ENCOUNTER — Other Ambulatory Visit: Payer: Self-pay | Admitting: Family Medicine
